# Patient Record
Sex: Male | Born: 1940 | Race: White | Hispanic: No | Marital: Married | Smoking: Former smoker
Health system: Southern US, Community
[De-identification: ages and names within clinical notes are randomized; demographics above are authoritative.]

## PROBLEM LIST (undated history)

## (undated) DIAGNOSIS — K219 Gastro-esophageal reflux disease without esophagitis: Secondary | ICD-10-CM

## (undated) DIAGNOSIS — J869 Pyothorax without fistula: Secondary | ICD-10-CM

## (undated) DIAGNOSIS — I10 Essential (primary) hypertension: Secondary | ICD-10-CM

## (undated) DIAGNOSIS — J449 Chronic obstructive pulmonary disease, unspecified: Secondary | ICD-10-CM

## (undated) DIAGNOSIS — J40 Bronchitis, not specified as acute or chronic: Secondary | ICD-10-CM

## (undated) DIAGNOSIS — N289 Disorder of kidney and ureter, unspecified: Secondary | ICD-10-CM

## (undated) DIAGNOSIS — K649 Unspecified hemorrhoids: Secondary | ICD-10-CM

## (undated) DIAGNOSIS — I493 Ventricular premature depolarization: Secondary | ICD-10-CM

## (undated) DIAGNOSIS — M1990 Unspecified osteoarthritis, unspecified site: Secondary | ICD-10-CM

## (undated) DIAGNOSIS — E785 Hyperlipidemia, unspecified: Secondary | ICD-10-CM

## (undated) HISTORY — DX: Hyperlipidemia, unspecified: E78.5

## (undated) HISTORY — PX: TONSILLECTOMY: SUR1361

## (undated) HISTORY — DX: Ventricular premature depolarization: I49.3

## (undated) HISTORY — DX: Pyothorax without fistula: J86.9

---

## 1988-09-09 HISTORY — PX: APPENDECTOMY: SHX54

## 2011-01-10 DIAGNOSIS — J869 Pyothorax without fistula: Secondary | ICD-10-CM

## 2011-01-10 HISTORY — DX: Pyothorax without fistula: J86.9

## 2011-01-15 DIAGNOSIS — R7989 Other specified abnormal findings of blood chemistry: Secondary | ICD-10-CM

## 2011-01-15 DIAGNOSIS — R071 Chest pain on breathing: Secondary | ICD-10-CM

## 2011-01-16 DIAGNOSIS — I219 Acute myocardial infarction, unspecified: Secondary | ICD-10-CM

## 2011-01-16 DIAGNOSIS — R0602 Shortness of breath: Secondary | ICD-10-CM

## 2011-01-18 ENCOUNTER — Encounter (HOSPITAL_COMMUNITY): Payer: Self-pay | Admitting: General Practice

## 2011-01-18 ENCOUNTER — Inpatient Hospital Stay (HOSPITAL_COMMUNITY)
Admission: AD | Admit: 2011-01-18 | Discharge: 2011-01-30 | DRG: 853 | Disposition: A | Discharge status: Home Health | Payer: Medicare (Managed Care) | Source: Other Acute Inpatient Hospital | Attending: Cardiothoracic Surgery | Admitting: Cardiothoracic Surgery

## 2011-01-18 ENCOUNTER — Inpatient Hospital Stay (HOSPITAL_COMMUNITY): Payer: Medicare (Managed Care)

## 2011-01-18 ENCOUNTER — Other Ambulatory Visit: Payer: Self-pay

## 2011-01-18 DIAGNOSIS — J869 Pyothorax without fistula: Secondary | ICD-10-CM

## 2011-01-18 DIAGNOSIS — E876 Hypokalemia: Secondary | ICD-10-CM | POA: Diagnosis present

## 2011-01-18 DIAGNOSIS — J189 Pneumonia, unspecified organism: Secondary | ICD-10-CM | POA: Diagnosis present

## 2011-01-18 DIAGNOSIS — E8779 Other fluid overload: Secondary | ICD-10-CM | POA: Diagnosis not present

## 2011-01-18 DIAGNOSIS — Z9889 Other specified postprocedural states: Secondary | ICD-10-CM

## 2011-01-18 DIAGNOSIS — A4902 Methicillin resistant Staphylococcus aureus infection, unspecified site: Secondary | ICD-10-CM | POA: Diagnosis present

## 2011-01-18 DIAGNOSIS — I214 Non-ST elevation (NSTEMI) myocardial infarction: Secondary | ICD-10-CM | POA: Diagnosis present

## 2011-01-18 DIAGNOSIS — Z79899 Other long term (current) drug therapy: Secondary | ICD-10-CM

## 2011-01-18 DIAGNOSIS — D62 Acute posthemorrhagic anemia: Secondary | ICD-10-CM | POA: Diagnosis not present

## 2011-01-18 DIAGNOSIS — L8992 Pressure ulcer of unspecified site, stage 2: Secondary | ICD-10-CM | POA: Diagnosis present

## 2011-01-18 DIAGNOSIS — R000 Tachycardia, unspecified: Secondary | ICD-10-CM

## 2011-01-18 DIAGNOSIS — I219 Acute myocardial infarction, unspecified: Secondary | ICD-10-CM | POA: Diagnosis present

## 2011-01-18 DIAGNOSIS — A419 Sepsis, unspecified organism: Principal | ICD-10-CM | POA: Diagnosis present

## 2011-01-18 DIAGNOSIS — L89109 Pressure ulcer of unspecified part of back, unspecified stage: Secondary | ICD-10-CM | POA: Diagnosis present

## 2011-01-18 DIAGNOSIS — I498 Other specified cardiac arrhythmias: Secondary | ICD-10-CM | POA: Diagnosis present

## 2011-01-18 DIAGNOSIS — D72829 Elevated white blood cell count, unspecified: Secondary | ICD-10-CM | POA: Diagnosis present

## 2011-01-18 DIAGNOSIS — B370 Candidal stomatitis: Secondary | ICD-10-CM | POA: Diagnosis not present

## 2011-01-18 DIAGNOSIS — F17200 Nicotine dependence, unspecified, uncomplicated: Secondary | ICD-10-CM | POA: Diagnosis present

## 2011-01-18 DIAGNOSIS — J449 Chronic obstructive pulmonary disease, unspecified: Secondary | ICD-10-CM | POA: Diagnosis present

## 2011-01-18 DIAGNOSIS — E119 Type 2 diabetes mellitus without complications: Secondary | ICD-10-CM | POA: Diagnosis present

## 2011-01-18 DIAGNOSIS — I10 Essential (primary) hypertension: Secondary | ICD-10-CM | POA: Diagnosis present

## 2011-01-18 DIAGNOSIS — J4489 Other specified chronic obstructive pulmonary disease: Secondary | ICD-10-CM | POA: Diagnosis present

## 2011-01-18 DIAGNOSIS — K219 Gastro-esophageal reflux disease without esophagitis: Secondary | ICD-10-CM | POA: Diagnosis present

## 2011-01-18 DIAGNOSIS — K5900 Constipation, unspecified: Secondary | ICD-10-CM | POA: Diagnosis not present

## 2011-01-18 HISTORY — DX: Unspecified osteoarthritis, unspecified site: M19.90

## 2011-01-18 HISTORY — DX: Bronchitis, not specified as acute or chronic: J40

## 2011-01-18 HISTORY — DX: Unspecified hemorrhoids: K64.9

## 2011-01-18 HISTORY — DX: Chronic obstructive pulmonary disease, unspecified: J44.9

## 2011-01-18 HISTORY — DX: Gastro-esophageal reflux disease without esophagitis: K21.9

## 2011-01-18 HISTORY — DX: Essential (primary) hypertension: I10

## 2011-01-18 HISTORY — DX: Disorder of kidney and ureter, unspecified: N28.9

## 2011-01-18 LAB — BLOOD GAS, ARTERIAL
Acid-base deficit: 2.1 mmol/L — ABNORMAL HIGH (ref 0.0–2.0)
Bicarbonate: 21.3 mEq/L (ref 20.0–24.0)
Drawn by: 347641
FIO2: 0.21 %
O2 Saturation: 93.7 %
Patient temperature: 98.6
TCO2: 22.3 mmol/L (ref 0–100)
pCO2 arterial: 31.3 mmHg — ABNORMAL LOW (ref 35.0–45.0)
pH, Arterial: 7.448 (ref 7.350–7.450)
pO2, Arterial: 67.5 mmHg — ABNORMAL LOW (ref 80.0–100.0)

## 2011-01-18 LAB — COMPREHENSIVE METABOLIC PANEL
ALT: 8 U/L (ref 0–53)
ALT: 8 U/L (ref 0–53)
AST: 14 U/L (ref 0–37)
AST: 13 U/L (ref 0–37)
Albumin: 1.7 g/dL — ABNORMAL LOW (ref 3.5–5.2)
Alkaline Phosphatase: 66 U/L (ref 39–117)
Alkaline Phosphatase: 55 U/L (ref 39–117)
BUN: 6 mg/dL (ref 6–23)
CO2: 23 mEq/L (ref 19–32)
CO2: 22 mEq/L (ref 19–32)
Calcium: 7.9 mg/dL — ABNORMAL LOW (ref 8.4–10.5)
Chloride: 101 mEq/L (ref 96–112)
Chloride: 102 mEq/L (ref 96–112)
Creatinine, Ser: 0.85 mg/dL (ref 0.50–1.35)
GFR calc Af Amer: >90 mL/min (ref >90)
GFR calc Af Amer: >90 mL/min (ref >90)
GFR calc non Af Amer: 88 mL/min — ABNORMAL LOW (ref >90)
GFR calc non Af Amer: 88 mL/min — ABNORMAL LOW (ref >90)
Glucose, Bld: 214 mg/dL — ABNORMAL HIGH (ref 70–99)
Glucose, Bld: 222 mg/dL — ABNORMAL HIGH (ref 70–99)
Potassium: 3.4 mEq/L — ABNORMAL LOW (ref 3.5–5.1)
Potassium: 3 mEq/L — ABNORMAL LOW (ref 3.5–5.1)
Sodium: 136 mEq/L (ref 135–145)
Sodium: 132 mEq/L — ABNORMAL LOW (ref 135–145)
Total Bilirubin: 0.6 mg/dL (ref 0.3–1.2)
Total Bilirubin: 0.5 mg/dL (ref 0.3–1.2)
Total Protein: 6 g/dL (ref 6.0–8.3)

## 2011-01-18 LAB — GLUCOSE, CAPILLARY: Glucose-Capillary: 232 mg/dL — ABNORMAL HIGH (ref 70–99)

## 2011-01-18 LAB — CBC
HCT: 37.9 % — ABNORMAL LOW (ref 39.0–52.0)
Hemoglobin: 13.6 g/dL (ref 13.0–17.0)
MCH: 28.7 pg (ref 26.0–34.0)
MCH: 28.4 pg (ref 26.0–34.0)
MCHC: 36 g/dL (ref 30.0–36.0)
MCHC: 35.9 g/dL (ref 30.0–36.0)
MCV: 79.7 fL (ref 78.0–100.0)
MCV: 79.1 fL (ref 78.0–100.0)
Platelets: 319 10*3/uL (ref 150–400)
Platelets: 317 10*3/uL (ref 150–400)
RBC: 5.12 MIL/uL (ref 4.22–5.81)
RBC: 4.79 MIL/uL (ref 4.22–5.81)
RDW: 12.9 % (ref 11.5–15.5)
RDW: 12.7 % (ref 11.5–15.5)
WBC: 15.1 10*3/uL — ABNORMAL HIGH (ref 4.0–10.5)

## 2011-01-18 LAB — TYPE AND SCREEN
ABO/RH(D): O POS
Antibody Screen: NEGATIVE

## 2011-01-18 LAB — APTT: aPTT: 29 seconds (ref 24–37)

## 2011-01-18 LAB — PROTIME-INR
INR: 1.31 (ref 0.00–1.49)
Prothrombin Time: 16.5 seconds — ABNORMAL HIGH (ref 11.6–15.2)

## 2011-01-18 LAB — ABO/RH: ABO/RH(D): O POS

## 2011-01-18 MED ORDER — INSULIN ASPART 100 UNIT/ML ~~LOC~~ SOLN: 0.0000 [IU] | Freq: Three times a day (TID) | SUBCUTANEOUS | Status: DC

## 2011-01-18 MED ORDER — INSULIN ASPART 100 UNIT/ML ~~LOC~~ SOLN
0.0000 [IU] | Freq: Three times a day (TID) | SUBCUTANEOUS | Status: DC
  Administered 2011-01-18: 3 [IU] via SUBCUTANEOUS
  Administered 2011-01-19: 5 [IU] via SUBCUTANEOUS
  Administered 2011-01-19: 3 [IU] via SUBCUTANEOUS

## 2011-01-18 MED ORDER — DOCUSATE SODIUM 100 MG PO CAPS
100.0000 mg | ORAL_CAPSULE | Freq: Every day | ORAL | Status: DC
  Administered 2011-01-18 – 2011-01-26 (×8): 100 mg via ORAL
  Filled 2011-01-18 (×9): qty 1
  Filled 2011-01-18: qty 2

## 2011-01-18 MED ORDER — FOLIC ACID 1 MG PO TABS
1.0000 mg | ORAL_TABLET | Freq: Every day | ORAL | Status: DC
  Administered 2011-01-18 – 2011-01-30 (×13): 1 mg via ORAL
  Filled 2011-01-18 (×14): qty 1

## 2011-01-18 MED ORDER — METOPROLOL TARTRATE 50 MG PO TABS
75.0000 mg | ORAL_TABLET | Freq: Two times a day (BID) | ORAL | Status: DC
  Administered 2011-01-18 – 2011-01-21 (×5): 75 mg via ORAL
  Filled 2011-01-18 (×10): qty 1

## 2011-01-18 MED ORDER — GUAIFENESIN ER 600 MG PO TB12
1200.0000 mg | ORAL_TABLET | Freq: Two times a day (BID) | ORAL | Status: DC
  Administered 2011-01-18 – 2011-01-30 (×23): 1200 mg via ORAL
  Filled 2011-01-18 (×26): qty 2

## 2011-01-18 MED ORDER — ROSUVASTATIN CALCIUM 20 MG PO TABS
20.0000 mg | ORAL_TABLET | Freq: Every day | ORAL | Status: DC
  Administered 2011-01-18 – 2011-01-30 (×12): 20 mg via ORAL
  Filled 2011-01-18 (×13): qty 1

## 2011-01-18 MED ORDER — PIPERACILLIN-TAZOBACTAM 3.375 G IVPB: 3.3750 g | Freq: Four times a day (QID) | INTRAVENOUS | Status: DC

## 2011-01-18 MED ORDER — ASPIRIN EC 81 MG PO TBEC
81.0000 mg | DELAYED_RELEASE_TABLET | Freq: Every day | ORAL | Status: DC
  Administered 2011-01-18 – 2011-01-30 (×13): 81 mg via ORAL
  Filled 2011-01-18 (×13): qty 1

## 2011-01-18 MED ORDER — ACETAMINOPHEN 650 MG RE SUPP: 650.0000 mg | Freq: Four times a day (QID) | RECTAL | Status: DC | PRN

## 2011-01-18 MED ORDER — ONDANSETRON HCL 4 MG/2ML IJ SOLN: 4.0000 mg | Freq: Four times a day (QID) | INTRAMUSCULAR | Status: DC | PRN

## 2011-01-18 MED ORDER — ONDANSETRON HCL 4 MG PO TABS: 4.0000 mg | ORAL_TABLET | Freq: Four times a day (QID) | ORAL | Status: DC | PRN

## 2011-01-18 MED ORDER — NICOTINE 21 MG/24HR TD PT24
21.0000 mg | MEDICATED_PATCH | Freq: Every day | TRANSDERMAL | Status: DC
  Administered 2011-01-18 – 2011-01-30 (×14): 21 mg via TRANSDERMAL
  Filled 2011-01-18 (×13): qty 1

## 2011-01-18 MED ORDER — HYDROCOD POLST-CHLORPHEN POLST 10-8 MG/5ML PO LQCR
5.0000 mL | Freq: Four times a day (QID) | ORAL | Status: DC | PRN
  Administered 2011-01-18 – 2011-01-19 (×2): 5 mL via ORAL
  Filled 2011-01-18 (×2): qty 5

## 2011-01-18 MED ORDER — PIPERACILLIN-TAZOBACTAM 3.375 G IVPB
3.3750 g | Freq: Three times a day (TID) | INTRAVENOUS | Status: DC
  Administered 2011-01-18 – 2011-01-30 (×34): 3.375 g via INTRAVENOUS
  Filled 2011-01-18 (×40): qty 50

## 2011-01-18 MED ORDER — INSULIN ASPART 100 UNIT/ML ~~LOC~~ SOLN
0.0000 [IU] | Freq: Every day | SUBCUTANEOUS | Status: DC
  Administered 2011-01-18: 2 [IU] via SUBCUTANEOUS
  Filled 2011-01-18: qty 3

## 2011-01-18 MED ORDER — HYDROCODONE-ACETAMINOPHEN 5-325 MG PO TABS: 1.0000 | ORAL_TABLET | ORAL | Status: DC | PRN

## 2011-01-18 MED ORDER — ACETAMINOPHEN 325 MG PO TABS
650.0000 mg | ORAL_TABLET | Freq: Four times a day (QID) | ORAL | Status: DC | PRN
  Administered 2011-01-18: 650 mg via ORAL
  Filled 2011-01-18: qty 2

## 2011-01-18 MED ORDER — GLIPIZIDE 5 MG PO TABS
5.0000 mg | ORAL_TABLET | Freq: Two times a day (BID) | ORAL | Status: DC
  Administered 2011-01-20 – 2011-01-24 (×10): 5 mg via ORAL
  Filled 2011-01-18 (×17): qty 1

## 2011-01-18 MED ORDER — PANTOPRAZOLE SODIUM 40 MG IV SOLR
40.0000 mg | Freq: Every day | INTRAVENOUS | Status: DC
  Administered 2011-01-18 – 2011-01-20 (×3): 40 mg via INTRAVENOUS
  Filled 2011-01-18 (×4): qty 40

## 2011-01-18 MED ORDER — VITAMIN B-1 100 MG PO TABS
100.0000 mg | ORAL_TABLET | Freq: Every day | ORAL | Status: DC
  Administered 2011-01-18 – 2011-01-29 (×12): 100 mg via ORAL
  Filled 2011-01-18 (×14): qty 1

## 2011-01-18 MED ORDER — TRAMADOL HCL 50 MG PO TABS: 50.0000 mg | ORAL_TABLET | Freq: Four times a day (QID) | ORAL | Status: DC | PRN

## 2011-01-18 MED ORDER — INSULIN ASPART 100 UNIT/ML ~~LOC~~ SOLN: 0.0000 [IU] | Freq: Every day | SUBCUTANEOUS | Status: DC
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
## 2011-01-18 NOTE — Progress Notes (Signed)
Gave patient 3 units of insulin for blood sugar of 199.  The insulin order wasn't until tomorrow but MD told to go ahead and give tonight with dinner.  Will continue to monitor.

## 2011-01-18 NOTE — Progress Notes (Signed)
SUBJECTIVE: Oscar [name] is known to me as I rounded on him while he was at [facility]. He has prolonged history of diabetes. He presented there with pneumonia and had progressive left-sided pleural effusion in spite of being treated with antibiotics. He was tachycardic. Initially he was thought to have atrial fibrillation. However, reviewing the ECG revealed sinus tachycardia with frequent PACs and occasional PVCs. He had chest discomfort that was mostly pleuritic in nature. However, his cardiac enzymes were mildly elevated with a troponin of 0.9. He was started on metoprolol. Plavix was added as well. He had an echocardiogram done which showed normal LV systolic function and no significant wall motion abnormalities. The plan was to perform a pharmacologic nuclear stress test after his respiratory status improves. However, he continued to have large size left pleural effusion. Thoracentesis was suggestive of empyema and he was thus transferred to [facility] for thoracic surgery consult.   Filed Vitals:   01/18/11 1420 01/18/11 1500 01/18/11 1519  BP: 109/73  112/62  Pulse: 140  127  Temp: 97.9 F (36.6 C)  98 F (36.7 C)  TempSrc: Oral  Oral  Resp: 19  20  Height: 6\' 2"  (1.88 m) 6\' 2"  (1.88 m)   Weight: 92.534 kg (204 lb) 92.534 kg (204 lb)   SpO2: 91%  92%   PHYSICAL EXAM General: Well developed, well nourished, in mild distress Head: Eyes PERRLA, No xanthomas.   Normal cephalic and atramatic  Lungs: Decreased breath sounds in the left base. Heart: Regular with frequent premature beats. He is tachycardic. Abdomen: Bowel sounds are positive, abdomen soft and non-tender without masses or                  Hernia's noted. Extremities: No clubbing.  Neuro: Alert and oriented X 3. Psych:  Good affect, responds appropriately  TELEMETRY: Reviewed telemetry pt in sinus tachycardia with frequent PACs. The  ASSESSMENT AND PLAN: 1. Pneumonia with large pleural effusion and possible empyema:  continue antibiotics. He will need surgery.  2. Sinus tachycardia with PACs and PVCs. No evidence of A-fib. Increase Metoprolol to 75 mg po bid and uptitrate to HR <100. Don't resume ACEI in order to allow more uptitration of rate control.  3. Recent type 2 NSTEMI likely supply demand. Echo showed normal LVSF and wall motion. Continue heart rate. Continue Aspirin but stop Plavix due to anticipated surgery. Start a statin given prolonged history of diabetes.  No plans for cardiac testing before surgery. Can proceed at an overall moderate risk. I suspect that his tachycardia should improve after effectively treating his infection.   Lorine Bears, MD, [facility] 01/18/2011 4:34 PM

## 2011-01-19 ENCOUNTER — Other Ambulatory Visit: Payer: Self-pay | Admitting: Cardiothoracic Surgery

## 2011-01-19 ENCOUNTER — Encounter (HOSPITAL_COMMUNITY)
Admission: AD | Disposition: A | Discharge status: Home Health | Payer: Self-pay | Source: Other Acute Inpatient Hospital | Attending: Cardiothoracic Surgery

## 2011-01-19 ENCOUNTER — Inpatient Hospital Stay (HOSPITAL_COMMUNITY): Payer: Medicare (Managed Care)

## 2011-01-19 ENCOUNTER — Encounter (HOSPITAL_COMMUNITY): Payer: Self-pay

## 2011-01-19 DIAGNOSIS — I214 Non-ST elevation (NSTEMI) myocardial infarction: Secondary | ICD-10-CM

## 2011-01-19 DIAGNOSIS — J869 Pyothorax without fistula: Secondary | ICD-10-CM

## 2011-01-19 HISTORY — PX: VIDEO BRONCHOSCOPY: SHX5072

## 2011-01-19 HISTORY — PX: VIDEO ASSISTED THORACOSCOPY: SHX5073

## 2011-01-19 LAB — BODY FLUID CULTURE: Gram Stain: NONE SEEN

## 2011-01-19 LAB — URINALYSIS, ROUTINE W REFLEX MICROSCOPIC
Glucose, UA: 100 mg/dL — AB
Hgb urine dipstick: NEGATIVE
Ketones, ur: 15 mg/dL — AB
Leukocytes, UA: NEGATIVE
Nitrite: NEGATIVE
Protein, ur: NEGATIVE mg/dL
Specific Gravity, Urine: 1.015 (ref 1.005–1.030)
Urobilinogen, UA: 4 mg/dL — ABNORMAL HIGH (ref 0.0–1.0)
pH: 5.5 (ref 5.0–8.0)

## 2011-01-19 LAB — POCT I-STAT 3, ART BLOOD GAS (G3+)
Bicarbonate: 21.7 mEq/L (ref 20.0–24.0)
TCO2: 23 mmol/L (ref 0–100)
pCO2 arterial: 39.9 mmHg (ref 35.0–45.0)
pH, Arterial: 7.341 — ABNORMAL LOW (ref 7.350–7.450)
pO2, Arterial: 70 mmHg — ABNORMAL LOW (ref 80.0–100.0)

## 2011-01-19 LAB — PROTIME-INR: INR: 1.27 (ref 0.00–1.49)

## 2011-01-19 LAB — GLUCOSE, CAPILLARY
Glucose-Capillary: 196 mg/dL — ABNORMAL HIGH (ref 70–99)
Glucose-Capillary: 180 mg/dL — ABNORMAL HIGH (ref 70–99)
Glucose-Capillary: 276 mg/dL — ABNORMAL HIGH (ref 70–99)

## 2011-01-19 LAB — CBC
MCV: 80.2 fL (ref 78.0–100.0)
Platelets: 274 10*3/uL (ref 150–400)
RBC: 3.73 MIL/uL — ABNORMAL LOW (ref 4.22–5.81)
WBC: 19 10*3/uL — ABNORMAL HIGH (ref 4.0–10.5)

## 2011-01-19 SURGERY — VIDEO ASSISTED THORACOSCOPY
Anesthesia: General | Site: Chest | Wound class: Clean

## 2011-01-19 MED ORDER — FENTANYL CITRATE 0.05 MG/ML IJ SOLN
50.0000 ug | INTRAMUSCULAR | Status: DC | PRN
  Administered 2011-01-19: 100 ug via INTRAVENOUS

## 2011-01-19 MED ORDER — PROPOFOL 10 MG/ML IV EMUL
INTRAVENOUS | Status: DC | PRN
  Administered 2011-01-19: 110 mg via INTRAVENOUS

## 2011-01-19 MED ORDER — FENTANYL CITRATE 0.05 MG/ML IJ SOLN
INTRAMUSCULAR | Status: DC | PRN
  Administered 2011-01-19: 150 ug via INTRAVENOUS
  Administered 2011-01-19: 100 ug via INTRAVENOUS

## 2011-01-19 MED ORDER — VANCOMYCIN HCL IN DEXTROSE 1-5 GM/200ML-% IV SOLN
1000.0000 mg | Freq: Two times a day (BID) | INTRAVENOUS | Status: AC
  Administered 2011-01-19: 1000 mg via INTRAVENOUS
  Filled 2011-01-19: qty 200

## 2011-01-19 MED ORDER — POTASSIUM CHLORIDE 10 MEQ/50ML IV SOLN
10.0000 meq | INTRAVENOUS | Status: AC
  Administered 2011-01-19 (×3): 10 meq via INTRAVENOUS
  Filled 2011-01-19: qty 150

## 2011-01-19 MED ORDER — ALBUMIN HUMAN 5 % IV SOLN
INTRAVENOUS | Status: DC | PRN
  Administered 2011-01-19 (×2): via INTRAVENOUS

## 2011-01-19 MED ORDER — OXYCODONE HCL 5 MG PO TABS
5.0000 mg | ORAL_TABLET | ORAL | Status: AC | PRN
  Administered 2011-01-20: 5 mg via ORAL
  Filled 2011-01-19: qty 1

## 2011-01-19 MED ORDER — ONDANSETRON HCL 4 MG/2ML IJ SOLN: 4.0000 mg | Freq: Four times a day (QID) | INTRAMUSCULAR | Status: DC | PRN

## 2011-01-19 MED ORDER — OXYCODONE-ACETAMINOPHEN 5-325 MG PO TABS: 1.0000 | ORAL_TABLET | ORAL | Status: DC | PRN

## 2011-01-19 MED ORDER — FENTANYL BOLUS VIA INFUSION
50.0000 ug | Freq: Four times a day (QID) | INTRAVENOUS | Status: DC | PRN
  Filled 2011-01-19: qty 100

## 2011-01-19 MED ORDER — EPHEDRINE SULFATE 50 MG/ML IJ SOLN
INTRAMUSCULAR | Status: DC | PRN
  Administered 2011-01-19 (×2): 10 mg via INTRAVENOUS

## 2011-01-19 MED ORDER — ACETAMINOPHEN 10 MG/ML IV SOLN
1000.0000 mg | Freq: Four times a day (QID) | INTRAVENOUS | Status: AC
  Administered 2011-01-19 – 2011-01-20 (×3): 1000 mg via INTRAVENOUS
  Filled 2011-01-19 (×4): qty 100

## 2011-01-19 MED ORDER — ALBUMIN HUMAN 5 % IV SOLN
12.5000 g | Freq: Once | INTRAVENOUS | Status: AC
  Administered 2011-01-19: 12.5 g via INTRAVENOUS
  Filled 2011-01-19: qty 250

## 2011-01-19 MED ORDER — BUPIVACAINE 0.5 % ON-Q PUMP SINGLE CATH 400 ML
400.0000 mL | INJECTION | Status: DC
  Filled 2011-01-19: qty 400

## 2011-01-19 MED ORDER — LIVING WELL WITH DIABETES BOOK
Freq: Once | Status: DC
  Filled 2011-01-19: qty 1

## 2011-01-19 MED ORDER — MORPHINE SULFATE 2 MG/ML IJ SOLN: 2.0000 mg | INTRAMUSCULAR | Status: DC | PRN

## 2011-01-19 MED ORDER — ROCURONIUM BROMIDE 100 MG/10ML IV SOLN
INTRAVENOUS | Status: DC | PRN
  Administered 2011-01-19: 50 mg via INTRAVENOUS

## 2011-01-19 MED ORDER — HEMOSTATIC AGENTS (NO CHARGE) OPTIME
TOPICAL | Status: DC | PRN
  Administered 2011-01-19: 1 via TOPICAL

## 2011-01-19 MED ORDER — CHLORHEXIDINE GLUCONATE 0.12 % MT SOLN
OROMUCOSAL | Status: AC
  Administered 2011-01-19: 15 mL
  Filled 2011-01-19: qty 15

## 2011-01-19 MED ORDER — ALBUTEROL SULFATE (5 MG/ML) 0.5% IN NEBU
2.5000 mg | INHALATION_SOLUTION | RESPIRATORY_TRACT | Status: DC
  Administered 2011-01-19 – 2011-01-20 (×5): 2.5 mg via RESPIRATORY_TRACT
  Filled 2011-01-19 (×6): qty 0.5

## 2011-01-19 MED ORDER — KCL IN DEXTROSE-NACL 20-5-0.45 MEQ/L-%-% IV SOLN
INTRAVENOUS | Status: DC
  Administered 2011-01-19 – 2011-01-20 (×2): via INTRAVENOUS
  Filled 2011-01-19 (×4): qty 1000

## 2011-01-19 MED ORDER — LACTATED RINGERS IV SOLN
INTRAVENOUS | Status: DC
  Administered 2011-01-19: 13:00:00 via INTRAVENOUS

## 2011-01-19 MED ORDER — LACTATED RINGERS IV SOLN
INTRAVENOUS | Status: DC | PRN
  Administered 2011-01-19 (×5): via INTRAVENOUS

## 2011-01-19 MED ORDER — PHENYLEPHRINE HCL 10 MG/ML IJ SOLN
10.0000 mg | INTRAVENOUS | Status: DC | PRN
  Administered 2011-01-19: 50 ug/min via INTRAVENOUS

## 2011-01-19 MED ORDER — BISACODYL 5 MG PO TBEC
10.0000 mg | DELAYED_RELEASE_TABLET | Freq: Every day | ORAL | Status: DC
  Administered 2011-01-20 – 2011-01-26 (×6): 10 mg via ORAL
  Filled 2011-01-19 (×7): qty 2

## 2011-01-19 MED ORDER — VECURONIUM BROMIDE 10 MG IV SOLR
INTRAVENOUS | Status: DC | PRN
  Administered 2011-01-19: 3 mg via INTRAVENOUS
  Administered 2011-01-19: 5 mg via INTRAVENOUS

## 2011-01-19 MED ORDER — INSULIN ASPART 100 UNIT/ML ~~LOC~~ SOLN
0.0000 [IU] | SUBCUTANEOUS | Status: DC
  Administered 2011-01-19 (×2): 12 [IU] via SUBCUTANEOUS
  Administered 2011-01-20: 2 [IU] via SUBCUTANEOUS
  Administered 2011-01-20: 4 [IU] via SUBCUTANEOUS
  Administered 2011-01-20: 8 [IU] via SUBCUTANEOUS
  Administered 2011-01-20: 4 [IU] via SUBCUTANEOUS
  Administered 2011-01-20: 12 [IU] via SUBCUTANEOUS
  Administered 2011-01-21: 4 [IU] via SUBCUTANEOUS
  Administered 2011-01-21: 8 [IU] via SUBCUTANEOUS
  Administered 2011-01-21 (×2): 4 [IU] via SUBCUTANEOUS
  Filled 2011-01-19: qty 3

## 2011-01-19 MED ORDER — SODIUM CHLORIDE 0.9 % IV SOLN
2.0000 mg/h | INTRAVENOUS | Status: DC
  Administered 2011-01-19: 2 mg/h via INTRAVENOUS
  Filled 2011-01-19: qty 10

## 2011-01-19 MED ORDER — SENNOSIDES-DOCUSATE SODIUM 8.6-50 MG PO TABS
1.0000 | ORAL_TABLET | Freq: Every evening | ORAL | Status: DC | PRN
  Filled 2011-01-19: qty 1

## 2011-01-19 MED ORDER — LIDOCAINE HCL (CARDIAC) 20 MG/ML IV SOLN
INTRAVENOUS | Status: DC | PRN
  Administered 2011-01-19: 100 mg via INTRAVENOUS

## 2011-01-19 MED ORDER — DEXMEDETOMIDINE HCL 100 MCG/ML IV SOLN
0.4000 ug/kg/h | INTRAVENOUS | Status: DC
  Administered 2011-01-19: 0.7 ug/kg/h via INTRAVENOUS
  Filled 2011-01-19 (×2): qty 2

## 2011-01-19 MED ORDER — SODIUM CHLORIDE 0.9 % IV SOLN
INTRAVENOUS | Status: DC
  Administered 2011-01-19: 20:00:00 via INTRAVENOUS
  Administered 2011-01-22: 20 mL via INTRAVENOUS

## 2011-01-19 MED ORDER — PHENYLEPHRINE HCL 10 MG/ML IJ SOLN
50.0000 ug/min | INTRAMUSCULAR | Status: DC
  Administered 2011-01-19: 10 ug/min via INTRAVENOUS
  Administered 2011-01-20: 15 ug/min via INTRAVENOUS
  Filled 2011-01-19 (×2): qty 1

## 2011-01-19 MED ORDER — SODIUM CHLORIDE 0.9 % IR SOLN
Status: DC | PRN
  Administered 2011-01-19: 2000 mL

## 2011-01-19 MED ORDER — POTASSIUM CHLORIDE 10 MEQ/50ML IV SOLN
10.0000 meq | Freq: Every day | INTRAVENOUS | Status: DC | PRN
  Administered 2011-01-20 – 2011-01-22 (×6): 10 meq via INTRAVENOUS
  Filled 2011-01-19: qty 50
  Filled 2011-01-19: qty 150
  Filled 2011-01-19 (×3): qty 50
  Filled 2011-01-19: qty 150

## 2011-01-19 MED ORDER — FENTANYL CITRATE 0.05 MG/ML IJ SOLN
50.0000 ug/h | INTRAMUSCULAR | Status: DC
  Administered 2011-01-19: 50 ug/h via INTRAVENOUS
  Filled 2011-01-19: qty 50

## 2011-01-19 MED ORDER — MIDAZOLAM HCL 5 MG/5ML IJ SOLN
INTRAMUSCULAR | Status: DC | PRN
  Administered 2011-01-19 (×2): 2 mg via INTRAVENOUS

## 2011-01-19 MED ORDER — BUPIVACAINE ON-Q PAIN PUMP (FOR ORDER SET NO CHG)
INJECTION | Status: DC
  Filled 2011-01-19: qty 1

## 2011-01-19 MED ORDER — FENTANYL CITRATE 0.05 MG/ML IJ SOLN
INTRAMUSCULAR | Status: AC
  Filled 2011-01-19: qty 2

## 2011-01-19 MED ORDER — POTASSIUM CHLORIDE CRYS ER 20 MEQ PO TBCR
20.0000 meq | EXTENDED_RELEASE_TABLET | Freq: Once | ORAL | Status: AC
  Administered 2011-01-19: 20 meq via ORAL
  Filled 2011-01-19: qty 1

## 2011-01-19 MED ORDER — MIDAZOLAM BOLUS VIA INFUSION
1.0000 mg | INTRAVENOUS | Status: DC | PRN
  Filled 2011-01-19: qty 2

## 2011-01-19 MED ORDER — SUFENTANIL CITRATE 50 MCG/ML IV SOLN
INTRAVENOUS | Status: DC | PRN
  Administered 2011-01-19: 10 ug via INTRAVENOUS
  Administered 2011-01-19: 15 ug via INTRAVENOUS
  Administered 2011-01-19: 10 ug via INTRAVENOUS
  Administered 2011-01-19: 15 ug via INTRAVENOUS

## 2011-01-19 MED ORDER — PHENYLEPHRINE HCL 10 MG/ML IJ SOLN
INTRAMUSCULAR | Status: DC | PRN
  Administered 2011-01-19 (×3): 80 ug via INTRAVENOUS

## 2011-01-19 SURGICAL SUPPLY — 70 items
BAG DECANTER FOR FLEXI CONT (MISCELLANEOUS) IMPLANT
BLADE SURG 11 STRL SS (BLADE) IMPLANT
CANISTER SUCTION 2500CC (MISCELLANEOUS) ×4 IMPLANT
CATH KIT ON Q 5IN SLV (PAIN MANAGEMENT) ×4 IMPLANT
CATH ROBINSON RED A/P 22FR (CATHETERS) IMPLANT
CATH THORACIC 28FR (CATHETERS) IMPLANT
CATH THORACIC 36FR (CATHETERS) IMPLANT
CATH THORACIC 36FR RT ANG (CATHETERS) IMPLANT
CLOSURE WOUND 1/2 X4 (GAUZE/BANDAGES/DRESSINGS) ×1
CLOTH BEACON ORANGE TIMEOUT ST (SAFETY) ×4 IMPLANT
CONN 1/2X3/8X3/8 Y GISH (MISCELLANEOUS) ×4 IMPLANT
CONN ST 1/4X3/8  BEN (MISCELLANEOUS) ×4
CONN ST 1/4X3/8 BEN (MISCELLANEOUS) ×4 IMPLANT
CONN Y 3/8X3/8X3/8  BEN (MISCELLANEOUS) ×2
CONN Y 3/8X3/8X3/8 BEN (MISCELLANEOUS) ×2 IMPLANT
CONT SPEC 4OZ CLIKSEAL STRL BL (MISCELLANEOUS) ×16 IMPLANT
COVER SURGICAL LIGHT HANDLE (MISCELLANEOUS) ×8 IMPLANT
DRAIN CHANNEL 28F RND 3/8 FF (WOUND CARE) ×4 IMPLANT
DRAIN CHANNEL 32F RND 10.7 FF (WOUND CARE) ×4 IMPLANT
DRAPE LAPAROSCOPIC ABDOMINAL (DRAPES) ×4 IMPLANT
DRAPE SLUSH MACHINE 52X66 (DRAPES) ×4 IMPLANT
DRSG OPSITE 4X5.5 SM (GAUZE/BANDAGES/DRESSINGS) ×4 IMPLANT
ELECT REM PT RETURN 9FT ADLT (ELECTROSURGICAL) ×4
ELECTRODE REM PT RTRN 9FT ADLT (ELECTROSURGICAL) ×2 IMPLANT
GLOVE BIO SURGEON STRL SZ 6 (GLOVE) ×4 IMPLANT
GLOVE BIO SURGEON STRL SZ7.5 (GLOVE) ×16 IMPLANT
GLOVE BIOGEL PI IND STRL 6 (GLOVE) ×4 IMPLANT
GLOVE BIOGEL PI INDICATOR 6 (GLOVE) ×4
GOWN STRL NON-REIN LRG LVL3 (GOWN DISPOSABLE) ×16 IMPLANT
HEMOSTAT SURGICEL 2X14 (HEMOSTASIS) ×4 IMPLANT
KIT BASIN OR (CUSTOM PROCEDURE TRAY) ×4 IMPLANT
KIT ROOM TURNOVER OR (KITS) ×4 IMPLANT
KIT SUCTION CATH 14FR (SUCTIONS) ×4 IMPLANT
NS IRRIG 1000ML POUR BTL (IV SOLUTION) ×8 IMPLANT
PACK CHEST (CUSTOM PROCEDURE TRAY) ×4 IMPLANT
PAD ARMBOARD 7.5X6 YLW CONV (MISCELLANEOUS) ×8 IMPLANT
SEALANT SURG COSEAL 4ML (VASCULAR PRODUCTS) IMPLANT
SOLUTION ANTI FOG 6CC (MISCELLANEOUS) ×4 IMPLANT
SPONGE GAUZE 4X4 12PLY (GAUZE/BANDAGES/DRESSINGS) ×4 IMPLANT
SPONGE TONSIL 1.25 RF SGL STRG (GAUZE/BANDAGES/DRESSINGS) ×8 IMPLANT
STAPLER VISISTAT 35W (STAPLE) ×4 IMPLANT
STRIP CLOSURE SKIN 1/2X4 (GAUZE/BANDAGES/DRESSINGS) ×3 IMPLANT
SUT CHROMIC 3 0 SH 27 (SUTURE) ×12 IMPLANT
SUT ETHILON 3 0 PS 1 (SUTURE) IMPLANT
SUT PROLENE 3 0 SH DA (SUTURE) IMPLANT
SUT PROLENE 4 0 RB 1 (SUTURE)
SUT PROLENE 4-0 RB1 .5 CRCL 36 (SUTURE) IMPLANT
SUT SILK  1 MH (SUTURE) ×6
SUT SILK 1 MH (SUTURE) ×6 IMPLANT
SUT SILK 2 0SH CR/8 30 (SUTURE) IMPLANT
SUT SILK 3 0SH CR/8 30 (SUTURE) IMPLANT
SUT VIC AB 1 CTX 18 (SUTURE) ×8 IMPLANT
SUT VIC AB 2 TP1 27 (SUTURE) IMPLANT
SUT VIC AB 2-0 CTX 36 (SUTURE) ×4 IMPLANT
SUT VIC AB 3-0 X1 27 (SUTURE) ×4 IMPLANT
SUT VICRYL 0 UR6 27IN ABS (SUTURE) IMPLANT
SUT VICRYL 2 TP 1 (SUTURE) IMPLANT
SWAB COLLECTION DEVICE MRSA (MISCELLANEOUS) IMPLANT
SYSTEM SAHARA CHEST DRAIN ATS (WOUND CARE) ×4 IMPLANT
TAPE CLOTH SURG 4X10 WHT LF (GAUZE/BANDAGES/DRESSINGS) ×4 IMPLANT
TIP APPLICATOR SPRAY EXTEND 16 (VASCULAR PRODUCTS) IMPLANT
TOWEL OR 17X24 6PK STRL BLUE (TOWEL DISPOSABLE) ×4 IMPLANT
TOWEL OR 17X26 10 PK STRL BLUE (TOWEL DISPOSABLE) ×8 IMPLANT
TRAP SPECIMEN MUCOUS 40CC (MISCELLANEOUS) ×20 IMPLANT
TRAY FOLEY CATH 14FR (SET/KITS/TRAYS/PACK) ×4 IMPLANT
TUBE ANAEROBIC SPECIMEN COL (MISCELLANEOUS) ×4 IMPLANT
TUBE CONNECTING 12'X1/4 (SUCTIONS) ×1
TUBE CONNECTING 12X1/4 (SUCTIONS) ×3 IMPLANT
TUNNELER SHEATH ON-Q 11GX8 (MISCELLANEOUS) ×4 IMPLANT
WATER STERILE IRR 1000ML POUR (IV SOLUTION) ×8 IMPLANT

## 2011-01-19 NOTE — Brief Op Note (Signed)
01/18/2011 - 01/19/2011  5:38 PM  PATIENT:  Oscar [name]  71 y.o. male  PRE-OPERATIVE DIAGNOSIS:  Left empyema  POST-OPERATIVE DIAGNOSIS:  Left empyema  PROCEDURE:  Procedure(s): VIDEO ASSISTED THORACOSCOPY with decortication of the left lung, drainage of empyema VIDEO BRONCHOSCOPY  SURGEON:  Surgeon(s): Kerin Perna III, MD  PHYSICIAN ASSISTANT: Coral Ceo PA-C    ANESTHESIA:   general  EBL:  Total I/O In: 3550 [I.V.:3000; IV Piggyback:550] Out: 650 [Urine:350; Blood:300]  BLOOD ADMINISTERED:none  Chest tubes  3 chest tubes in the left pleural space   LOCAL MEDICATIONS USED:  NONE  SPECIMEN:  Pleural peel for culture and pathology, pleural fluid for culture and cytology  DISPOSITION OF SPECIMEN  pathology, lab  COUNTS:  YES  TOURNIQUET:  None    PLAN OF CARE: Admit to inpatient   PATIENT DISPOSITION: To SICU intubated   Delay Lovenox prophylaxis for 24 hours

## 2011-01-19 NOTE — Progress Notes (Signed)
Patient ID: Oscar [name], male   DOB: 09-24-1940, 71 y.o.   MRN: [id number] S/p drainage empyema BP 112/62  Pulse 95  Temp(Src) 98.1 F (36.7 C) (Oral)  Resp 19  Ht 6\' 2"  (1.88 m)  Wt 93.441 kg (206 lb)  BMI 26.45 kg/m2  SpO2 91% Intubated, sedated 450 cc bloody fluid from CT since surgery

## 2011-01-19 NOTE — Transfer of Care (Signed)
Immediate Anesthesia Transfer of Care Note  Patient: Oscar [name]  Procedure(s) Performed:  VIDEO ASSISTED THORACOSCOPY; VIDEO BRONCHOSCOPY  Patient Location: SICU  Anesthesia Type: General  Level of Consciousness: sedated, unresponsive and pateint uncooperative  Airway & Oxygen Therapy: Patient remains intubated per anesthesia plan and Patient placed on Ventilator (see vital sign flow sheet for setting)  Post-op Assessment: Report given to PACU RN, Post -op Vital signs reviewed and stable and Patient moving all extremities  Post vital signs: Reviewed and stable Filed Vitals:   01/19/11 1100  BP: 112/62  Pulse: 95  Temp: 36.7 C  Resp: 19    Complications: No apparent anesthesia complications

## 2011-01-19 NOTE — Progress Notes (Signed)
@   Subjective:  Mild dyspnea; chest "sore" in left rib area   Objective:  Filed Vitals:   01/18/11 1519 01/18/11 2015 01/19/11 0500 01/19/11 0610  BP: 112/62 117/62  109/65  Pulse: 127 100  52  Temp: 98 F (36.7 C) 97.6 F (36.4 C)  97.2 F (36.2 C)  TempSrc: Oral Oral  Oral  Resp: 20 20  20   Height:      Weight:   206 lb (93.441 kg)   SpO2: 92% 92%  92%    Intake/Output from previous day:  Intake/Output Summary (Last 24 hours) at 01/19/11 0711 Last data filed at 01/18/11 1900  Gross per 24 hour  Intake    650 ml  Output      0 ml  Net    650 ml    Physical Exam: Physical exam: Well-developed frail in no acute distress.  Skin is warm and dry.  HEENT is significant for deviation of right eye Neck is supple. No thyromegaly.  Chest is diminished BS left lung field Cardiovascular exam is irregular Abdominal exam nontender or distended. No masses palpated. Extremities show trace edema. neuro grossly intact    Lab Results: Basic Metabolic Panel:  Basename 01/18/11 2009 01/18/11 1705  NA 132* 136  K 3.0* 3.4*  CL 102 101  CO2 22 23  GLUCOSE 222* 214*  BUN 6 7  CREATININE 0.85 0.84  CALCIUM 7.9* 8.2*  MG -- 1.7  PHOS -- --   CBC:  Basename 01/18/11 2009 01/18/11 1705  WBC 15.1* 16.4*  NEUTROABS -- --  HGB 13.6 14.7  HCT 37.9* 40.8  MCV 79.1 79.7  PLT 317 319   Cardiac Enzymes: 1) empyema - continue antibiotics; thoracic surgery plans VATS. 2) NSTEMI - continue ASA, metoprolol and statin; most likely demand ischemia; plan myoview after he recovers form lung infection. Add DVT prophylaxis heparin following surgery. 3) tachycardia - continue metoprolol; telemetry reviewed; sinus with pacs and pvcs. 4) DM - continue present meds and follow CBGs. 5) Decubitus ulcer - wound care consult 6) hypertension - continue present BP meds 7) tobacco abuse - DC  Assessment/Plan:    Oscar [name] 01/19/2011, 7:11 AM

## 2011-01-19 NOTE — Progress Notes (Signed)
INITIAL ADULT NUTRITION ASSESSMENT Date: 01/19/2011   Time: 10:23 AM Reason for Assessment: Consult, unintentional weight loss  ASSESSMENT: Male 71 y.o.  Dx:  Patient Active Problem List  Diagnoses  . Tachycardia  . Myocardial infarction  . Acute myocardial infarction, subendocardial infarction, initial episode of care    Hx:  Past Medical History  Diagnosis Date  . Hypertension   . Diabetes mellitus   . Bronchitis   . GERD (gastroesophageal reflux disease)   . Hemorrhoid   . Arthritis     "fingers"  . COPD (chronic obstructive pulmonary disease)   . Acute renal insufficiency     During admission 01/2011    Related Meds:     . aspirin EC  81 mg Oral Daily  . docusate sodium  100 mg Oral Daily  . folic acid  1 mg Oral Daily  . glipiZIDE  5 mg Oral BID AC  . guaiFENesin  1,200 mg Oral BID  . insulin aspart  0-15 Units Subcutaneous TID WC  . insulin aspart  0-5 Units Subcutaneous QHS  . metoprolol tartrate  75 mg Oral BID  . nicotine  21 mg Transdermal Daily  . pantoprazole (PROTONIX) IV  40 mg Intravenous QHS  . piperacillin-tazobactam (ZOSYN)  IV  3.375 g Intravenous Q8H  . potassium chloride  20 mEq Oral Once  . rosuvastatin  20 mg Oral QHS  . thiamine  100 mg Oral Daily  . DISCONTD: insulin aspart  0-15 Units Subcutaneous TID WC  . DISCONTD: insulin aspart  0-5 Units Subcutaneous QHS  . DISCONTD: piperacillin-tazobactam (ZOSYN)  IV  3.375 g Intravenous Q6H     Ht: 6\' 2"  (188 cm)  Wt: 206 lb (93.441 kg)  Ideal Wt: 86.4 kg  % Ideal Wt: 108%  Usual Wt: 220-230 lbs % Usual Wt: 93% of 200 lbs  Body mass index is 26.45 kg/(m^2). Overweight  Food/Nutrition Related Hx: Patient reports he as been losing weight for several months since he was sick with pneumonia. Loss of appetite is still ongoing.    Labs:  CMP     Component Value Date/Time   NA 132* 01/18/2011 2009   K 3.0* 01/18/2011 2009   CL 102 01/18/2011 2009   CO2 22 01/18/2011 2009   GLUCOSE 222*  01/18/2011 2009   BUN 6 01/18/2011 2009   CREATININE 0.85 01/18/2011 2009   CALCIUM 7.9* 01/18/2011 2009   PROT 6.0 01/18/2011 2009   ALBUMIN 1.7* 01/18/2011 2009   AST 13 01/18/2011 2009   ALT 8 01/18/2011 2009   ALKPHOS 55 01/18/2011 2009   BILITOT 0.5 01/18/2011 2009   GFRNONAA 88* 01/18/2011 2009   GFRAA >90 01/18/2011 2009   CBG (last 3)   Basename 01/19/11 0606 01/18/11 2118 01/18/11 1653  GLUCAP 196* 232* 199*     I/O last 3 completed shifts: In: 650 [P.O.:600; IV Piggyback:50] Out: -    Diet Order: NPO  Supplements/Tube Feeding: none  IVF: saline lock  Estimated Nutritional Needs:   Kcal: 2100-2300 Protein: 75-95 gm  Fluid:>2 L  Patient is NPO now for surgery. Previous diet, carb mod po intake of only 25%. Likely will need supplements post surgery to maintain weight and protein status. Patient states he does not like milk based drinks, would prefer juice. Patient has DM with high cbg's. RD will likely have to provide Pro-stat. Weight loss based on UWB of 220 lbs and current weight 206 lbs does not meet the criteria for malnutrition. Unable to find  any documented weights from previous admissions or office visits.    NUTRITION DIAGNOSIS: -Inadequate oral intake (NI-2.1).  Status: Ongoing  RELATED TO: poor appetite, no po intake  AS EVIDENCE BY: 25% meals po, new NPO status  MONITORING/EVALUATION(Goals): Goal: PO intake will resume after surgery and meet >90% of estimated nutrition needs Monitor: PO intake, weights, labs, I/O's, supplements  EDUCATION NEEDS: -No education needs identified at this time  INTERVENTION: 1. Once patient diet has advanced after surgery, RD will ad supplements as appropriate 2. RD to follow  Dietitian [phone]  DOCUMENTATION CODES Per approved criteria  -Not Applicable    Clarene Duke MARIE 01/19/2011, 10:23 AM

## 2011-01-19 NOTE — Anesthesia Procedure Notes (Signed)
Procedure Name: Intubation Date/Time: 01/19/2011 1:18 PM Performed by: Fuller Canada Pre-anesthesia Checklist: Patient identified, Timeout performed, Emergency Drugs available, Suction available and Patient being monitored Patient Re-evaluated:Patient Re-evaluated prior to inductionOxygen Delivery Method: Circle System Utilized Preoxygenation: Pre-oxygenation with 100% oxygen Intubation Type: IV induction Ventilation: Mask ventilation without difficulty Laryngoscope Size: Mac and 3 Grade View: Grade I Endobronchial tube: Left, Double lumen EBT, EBT position confirmed by fiberoptic bronchoscope and EBT position confirmed by auscultation and 39 Fr Number of attempts: 1 Airway Equipment and Method: stylet Tube secured with: Tape Dental Injury: Teeth and Oropharynx as per pre-operative assessment

## 2011-01-19 NOTE — Plan of Care (Signed)
Pt agitated and attempting to pull out ET tube.  Attempts to redirect patient unsuccessful.  Precedex infusing.   MD notifed.

## 2011-01-19 NOTE — Brief Op Note (Signed)
01/18/2011 - 01/19/2011  3:14 PM  PATIENT:  Karena Addison  71 y.o. male  PRE-OPERATIVE DIAGNOSIS:  Left empyema  POST-OPERATIVE DIAGNOSIS:  Left empyema  PROCEDURE:  Procedure(s): LEFT VIDEO ASSISTED THORACOSCOPY, LEFT MINI-THORACOTOMY, DRAINAGE OF EMPYEMA, DECORTICATION, VIDEO BRONCHOSCOPY  SURGEON:  Surgeon(s): [name], MD  ASSISTANT: Coral Ceo, PA-C  ANESTHESIA:   general  SPECIMEN:  Source of Specimen:  Left pleural peel, left empyema  DISPOSITION OF SPECIMEN:  Pathology  DRAINS: 1 36 Fr CT, 1 28 and 1 32 Blake drain  PATIENT CONDITION:  ICU - intubated and hemodynamically stable.

## 2011-01-19 NOTE — Progress Notes (Signed)
Pt potassium drop to 3.0. Called md Tyrone Sage and made him aware. Orders were given. Will continue to monitor.

## 2011-01-19 NOTE — Anesthesia Preprocedure Evaluation (Addendum)
Anesthesia Evaluation  Patient identified by MRN, date of birth, ID band Patient awake    Reviewed: Allergy & Precautions, H&P , NPO status , Patient's Chart, lab work & pertinent test results  Airway Mallampati: I TM Distance: >3 FB Neck ROM: full    Dental  (+) Poor Dentition and Partial Upper   Pulmonary COPDCurrent Smoker,  + rhonchi  + decreased breath sounds      Cardiovascular hypertension, regular Normal    Neuro/Psych Negative Neurological ROS     GI/Hepatic Neg liver ROS, GERD-  ,  Endo/Other  Diabetes mellitus-, Type 2, Oral Hypoglycemic Agents  Renal/GU negative Renal ROS  Genitourinary negative   Musculoskeletal   Abdominal   Peds  Hematology negative hematology ROS (+)   Anesthesia Other Findings   Reproductive/Obstetrics                          Anesthesia Physical Anesthesia Plan  ASA: III  Anesthesia Plan: General ETT   Post-op Pain Management:    Induction: Intravenous  Airway Management Planned: Double Lumen EBT  Additional Equipment: Arterial line and CVP  Intra-op Plan:   Post-operative Plan: Extubation in OR  Informed Consent: I have reviewed the patients History and Physical, chart, labs and discussed the procedure including the risks, benefits and alternatives for the proposed anesthesia with the patient or authorized representative who has indicated his/her understanding and acceptance.     Plan Discussed with: Surgeon, CRNA and Anesthesiologist  Anesthesia Plan Comments:        Anesthesia Quick Evaluation

## 2011-01-20 ENCOUNTER — Inpatient Hospital Stay (HOSPITAL_COMMUNITY): Payer: Medicare (Managed Care)

## 2011-01-20 ENCOUNTER — Encounter (HOSPITAL_COMMUNITY): Payer: Self-pay | Admitting: Cardiothoracic Surgery

## 2011-01-20 DIAGNOSIS — E1165 Type 2 diabetes mellitus with hyperglycemia: Secondary | ICD-10-CM

## 2011-01-20 DIAGNOSIS — IMO0001 Reserved for inherently not codable concepts without codable children: Secondary | ICD-10-CM

## 2011-01-20 LAB — POCT I-STAT 3, ART BLOOD GAS (G3+)
Acid-base deficit: 3 mmol/L — ABNORMAL HIGH (ref 0.0–2.0)
Acid-base deficit: 4 mmol/L — ABNORMAL HIGH (ref 0.0–2.0)
Bicarbonate: 22.9 mEq/L (ref 20.0–24.0)
O2 Saturation: 93 %
O2 Saturation: 94 %
Patient temperature: 97.8
Patient temperature: 97.8
Patient temperature: 98
TCO2: 24 mmol/L (ref 0–100)
TCO2: 23 mmol/L (ref 0–100)
TCO2: 22 mmol/L (ref 0–100)
pCO2 arterial: 35.1 mmHg (ref 35.0–45.0)
pH, Arterial: 7.423 (ref 7.350–7.450)

## 2011-01-20 LAB — GLUCOSE, CAPILLARY
Glucose-Capillary: 255 mg/dL — ABNORMAL HIGH (ref 70–99)
Glucose-Capillary: 193 mg/dL — ABNORMAL HIGH (ref 70–99)

## 2011-01-20 LAB — POCT I-STAT 4, (NA,K, GLUC, HGB,HCT): Sodium: 137 mEq/L (ref 135–145)

## 2011-01-20 LAB — CBC
MCV: 79.7 fL (ref 78.0–100.0)
Platelets: 254 10*3/uL (ref 150–400)
RBC: 3.25 MIL/uL — ABNORMAL LOW (ref 4.22–5.81)
WBC: 12.8 10*3/uL — ABNORMAL HIGH (ref 4.0–10.5)

## 2011-01-20 LAB — BASIC METABOLIC PANEL
CO2: 23 mEq/L (ref 19–32)
Chloride: 105 mEq/L (ref 96–112)
Creatinine, Ser: 0.79 mg/dL (ref 0.50–1.35)
GFR calc Af Amer: >90 mL/min (ref >90)
Potassium: 3.6 mEq/L (ref 3.5–5.1)
Sodium: 133 mEq/L — ABNORMAL LOW (ref 135–145)

## 2011-01-20 LAB — CULTURE, RESPIRATORY W GRAM STAIN

## 2011-01-20 MED ORDER — OXYCODONE HCL 5 MG PO TABS
5.0000 mg | ORAL_TABLET | ORAL | Status: DC | PRN
  Administered 2011-01-20 – 2011-01-30 (×19): 5 mg via ORAL
  Filled 2011-01-20 (×8): qty 1
  Filled 2011-01-20: qty 2
  Filled 2011-01-20 (×6): qty 1
  Filled 2011-01-20: qty 2
  Filled 2011-01-20 (×2): qty 1

## 2011-01-20 MED ORDER — ALBUTEROL SULFATE (5 MG/ML) 0.5% IN NEBU
2.5000 mg | INHALATION_SOLUTION | Freq: Four times a day (QID) | RESPIRATORY_TRACT | Status: DC
  Administered 2011-01-21 – 2011-01-25 (×20): 2.5 mg via RESPIRATORY_TRACT
  Filled 2011-01-20 (×20): qty 0.5

## 2011-01-20 MED ORDER — INSULIN GLARGINE 100 UNIT/ML ~~LOC~~ SOLN
20.0000 [IU] | Freq: Every day | SUBCUTANEOUS | Status: DC
  Filled 2011-01-20: qty 3

## 2011-01-20 MED ORDER — MORPHINE BOLUS VIA INFUSION: 2.0000 mg | INTRAVENOUS | Status: DC | PRN

## 2011-01-20 MED ORDER — CHLORHEXIDINE GLUCONATE 0.12 % MT SOLN
OROMUCOSAL | Status: AC
  Administered 2011-01-20: 15 mL
  Filled 2011-01-20: qty 15

## 2011-01-20 MED ORDER — POTASSIUM CHLORIDE 10 MEQ/50ML IV SOLN
10.0000 meq | INTRAVENOUS | Status: AC | PRN
  Administered 2011-01-20 (×3): 10 meq via INTRAVENOUS

## 2011-01-20 MED ORDER — POTASSIUM CHLORIDE IN NACL 20-0.9 MEQ/L-% IV SOLN
INTRAVENOUS | Status: DC
  Administered 2011-01-20 – 2011-01-21 (×2): via INTRAVENOUS
  Filled 2011-01-20 (×2): qty 1000

## 2011-01-20 MED ORDER — SODIUM CHLORIDE 0.9 % IV SOLN: INTRAVENOUS | Status: DC

## 2011-01-20 MED ORDER — POTASSIUM CHLORIDE 10 MEQ/50ML IV SOLN
INTRAVENOUS | Status: AC
  Administered 2011-01-20: 10 meq via INTRAVENOUS
  Filled 2011-01-20: qty 100

## 2011-01-20 MED ORDER — LACTATED RINGERS IV SOLN: INTRAVENOUS | Status: DC

## 2011-01-20 MED ORDER — ENOXAPARIN SODIUM 40 MG/0.4ML ~~LOC~~ SOLN
40.0000 mg | SUBCUTANEOUS | Status: DC
  Administered 2011-01-21 – 2011-01-29 (×10): 40 mg via SUBCUTANEOUS
  Filled 2011-01-20 (×11): qty 0.4

## 2011-01-20 MED ORDER — PRO-STAT SUGAR FREE PO LIQD
30.0000 mL | Freq: Two times a day (BID) | ORAL | Status: DC
  Filled 2011-01-20 (×6): qty 30

## 2011-01-20 MED ORDER — MORPHINE SULFATE 2 MG/ML IJ SOLN
2.0000 mg | INTRAMUSCULAR | Status: DC | PRN
  Administered 2011-01-20: 2 mg via INTRAVENOUS
  Filled 2011-01-20: qty 1

## 2011-01-20 MED ORDER — FUROSEMIDE 10 MG/ML IJ SOLN
40.0000 mg | Freq: Once | INTRAMUSCULAR | Status: AC
  Administered 2011-01-21: 40 mg via INTRAVENOUS
  Filled 2011-01-20: qty 4

## 2011-01-20 NOTE — Anesthesia Postprocedure Evaluation (Signed)
  Anesthesia Post-op Note  Patient: Oscar [name]  Procedure(s) Performed:  VIDEO ASSISTED THORACOSCOPY; VIDEO BRONCHOSCOPY  Patient Location: SICU  Anesthesia Type: General  Level of Consciousness: awake, alert , oriented and patient cooperative  Airway and Oxygen Therapy: Patient Spontanous Breathing  Post-op Pain: mild  Post-op Assessment: Post-op Vital signs reviewed, Patient's Cardiovascular Status Stable, Respiratory Function Stable, Patent Airway, No signs of Nausea or vomiting, Adequate PO intake and Pain level controlled  Post-op Vital Signs: Reviewed and stable  Complications: No apparent anesthesia complications

## 2011-01-20 NOTE — Progress Notes (Signed)
Pt extubated by RT per MD order to 4L [place].  Patient VS remain stable and tolerated well.  Pt alert and oriented and resting.  Nursing will continue to monitor.

## 2011-01-20 NOTE — Progress Notes (Signed)
1 Day Post-Op Procedure(s) (LRB): VIDEO ASSISTED THORACOSCOPY (Left) VIDEO BRONCHOSCOPY (N/A) Subjective:                   *                                           [address]            [place] [address]          [phone]    POD #1 L VATS decort on vent   Objective: Vital signs in last 24 hours: Temp:  [97.5 F (36.4 C)-98.4 F (36.9 C)] 97.8 F (36.6 C) (01/11 0726) Pulse Rate:  [65-95] 79  (01/11 0700) Cardiac Rhythm:  [-] Normal sinus rhythm (01/11 0000) Resp:  [12-22] 12  (01/11 0700) BP: (74-112)/(47-70) 83/57 mmHg (01/11 0700) SpO2:  [91 %-100 %] 98 % (01/11 0804) Arterial Line BP: (76-115)/(36-62) 105/50 mmHg (01/11 0700) FiO2 (%):  [40 %-50.7 %] 40 % (01/11 0804) Weight:  [483 lb 11 oz (219.4 kg)] 483 lb 11 oz (219.4 kg) (01/11 0400)  Hemodynamic parameters for last 24 hours: CVP:  [7 mmHg-11 mmHg] 7 mmHg  Intake/Output from previous day: 01/10 0701 - 01/11 0700 In: 6598 [I.V.:5275.5; NG/GT:60; IV Piggyback:1262.5] Out: 2425 [Urine:1365; Blood:300; Chest Tube:760] Intake/Output this shift:    EXAM   Coarse rales NSR BP 110 on low dose NEO  Lab Results:  Basename 01/20/11 0340 01/19/11 2140  WBC 12.8* 19.0*  HGB 9.2* 10.5*  HCT 25.9* 29.9*  PLT 254 274   BMET:  Basename 01/20/11 0340 01/18/11 2009  NA 133* 132*  K 3.6 3.0*  CL 105 102  CO2 23 22  GLUCOSE 254* 222*  BUN 8 6  CREATININE 0.79 0.85  CALCIUM 7.2* 7.9*    PT/INR:  Basename 01/19/11 0500  LABPROT 16.2*  INR 1.27   ABG    Component Value Date/Time   PHART 7.423 01/20/2011 0337   HCO3 22.9 01/20/2011 0337   TCO2 24 01/20/2011 0337   ACIDBASEDEF 1.0 01/20/2011 0337   O2SAT 98.0 01/20/2011 0337   CBG (last 3)   Basename 01/20/11 0724 01/20/11 0343 01/19/11 2347  GLUCAP 201* 255* 298*    Assessment/Plan: S/P Procedure(s) (LRB): VIDEO ASSISTED THORACOSCOPY (Left) VIDEO BRONCHOSCOPY (N/A) Extubate   LOS: 2 days    VAN TRIGT III,PETER 01/20/2011

## 2011-01-20 NOTE — Anesthesia Postprocedure Evaluation (Signed)
  Anesthesia Post-op Note  Patient: Oscar [name]  Procedure(s) Performed:  VIDEO ASSISTED THORACOSCOPY; VIDEO BRONCHOSCOPY  Patient Location: PACU  Anesthesia Type: General  Level of Consciousness: awake  Airway and Oxygen Therapy: Patient Spontanous Breathing  Post-op Pain: mild  Post-op Assessment: Post-op Vital signs reviewed  Post-op Vital Signs: stable  Complications: No apparent anesthesia complications

## 2011-01-20 NOTE — Progress Notes (Signed)
Nutrition Brief Follow Up Note:  Patient seen on 1/10 by RD. Diet has advanced to [employer]. PO intake 0%. New recorded weight is inaccurate, yesterday weight was 206 lbs, today 483 lbs. As per addressed in initial note, RD will add Pro-stat BID to increase protein intake to promote healing. Chart reviewed, will follow.   CBG (last 3)   Basename 01/20/11 1201 01/20/11 0724 01/20/11 0343  GLUCAP 153* 201* 255*    CMP     Component Value Date/Time   NA 133* 01/20/2011 0340   K 3.6 01/20/2011 0340   CL 105 01/20/2011 0340   CO2 23 01/20/2011 0340   GLUCOSE 254* 01/20/2011 0340   BUN 8 01/20/2011 0340   CREATININE 0.79 01/20/2011 0340   CALCIUM 7.2* 01/20/2011 0340   PROT 6.0 01/18/2011 2009   ALBUMIN 1.7* 01/18/2011 2009   AST 13 01/18/2011 2009   ALT 8 01/18/2011 2009   ALKPHOS 55 01/18/2011 2009   BILITOT 0.5 01/18/2011 2009   GFRNONAA >90 01/20/2011 0340   GFRAA >90 01/20/2011 0340   Gwen Pounds, Shreeya Recendiz MARIE #[phone]

## 2011-01-20 NOTE — Op Note (Signed)
[name], [name] NO.:  [id number]  MEDICAL RECORD NO.:  [id number]  LOCATION:  2306                         FACILITY:  [facility]  PHYSICIAN:  Kerin Perna, M.D.  DATE OF BIRTH:  1940-03-27  DATE OF PROCEDURE:  01/19/2011 DATE OF DISCHARGE:                              OPERATIVE REPORT   OPERATIONS: 1. Left video-assisted thoracoscopic surgery, main thoracotomy with     drainage of left empyema and decortication of left lung. 2. Video bronchoscopy. 3. Placement of wound On-Q pain irrigation system.  SURGEON:  Kerin Perna, MD  ASSISTANT:  Coral Ceo, P A-C  ANESTHESIA:  General by Dr. Judie Petit.  PREOPERATIVE DIAGNOSES:  Left lower lobe pneumonia with empyema, sepsis.  POSTOPERATIVE DIAGNOSES:  Left lower lobe pneumonia with empyema, sepsis.  INDICATIONS:  The patient is a chronically ill 71 year old Caucasian male, ex-smoker, transferred from [facility] with a large loculated empyema and sepsis.  He had undergone thoracentesis with rapid reaccumulation of the fluid.  I discussed left VATS decortication and drainage of empyema with the patient including the details of surgery, expected recovery, and the risks and he understood and agreed to proceed with surgery.  PROCEDURE:  The patient was brought directly to the operative room where general anesthesia was induced with a double-lumen endotracheal tube. The patient was turned to expose left chest.  After proper time-out, the patient was prepped and draped as a sterile field.  A small incision was made at the tip of the scapula and the fifth interspace and pleural fluid was immediately drained.  This was sent for culture and cytology. The VATS camera was inserted.  There was poor visibility due to multiloculated collections and heavy shaggy pleural peel.  The VATS scope was removed and the incision was extended approximately 8 cm.  The ribs were gently spread, but not divided.  The  pleural space was then dissected out.  There was a large thick peel on the lower lobe as well as the lingula of the upper lobe.  This was removed on the parietal pleural surface.  This was removed on the visceral pleural surface. There was also a large parietal pleural peel and this was removed around the left lung cavity.  There was a loculated fluid inferiorly on top of the diaphragm as well as superiorly.  There was a loculated milky fluid anteriorly along the pericardium and all these areas and spaces and loculations were opened completely.  The peel was completely removed from the lung.  The area of the bronchopleural fissure was identified and cauterized and closed with a figure-of-eight 3-0 chromic suture. This was in the basilar lower lobe segment.  The pleural space was irrigated with warm saline irrigation.  Chest tubes were placed anteriorly and posteriorly as well as in the costophrenic space and brought out through separate incisions.  The lung was reexpanded completely under direct vision.  The ribs were reapproximated with #2 Vicryl.  The muscle layers were closed in layers using #1 Vicryl. Subcutaneous was closed with running Vicryl and the skin was closed with staples.  An On-Q catheter was placed between the chest tubes and the incision, flushed with  0.5% Marcaine, and secured with silk suture.  Sterile dressing was applied.  The patient was then turned supine and double-lumen tube was exchanged for single-lumen tube.  Through the single-lumen tube, a video bronchoscope was passed.  The distal trachea and carina were sharp and normal.  The left mainstem bronchus had some cloudy secretions and these were completely cleared and irrigated.  There were endobronchial masses noted in the left upper lobe or left lower lobe segments.  The bronchoscope was then used to examine the right mainstem bronchus and the segments of the right upper lobe, right middle lobe, and right  lower lobe.  There were no endobronchial lesions noted.  The bronchoscope was then withdrawn.  Washings from the left lung were sent for both culture and cytology.  The patient was then directed to the intensive care unit while being intubated in critical, but stable condition.     Kerin Perna, M.D.     PV/MEDQ  D:  01/19/2011  T:  01/20/2011  Job:  [id number]

## 2011-01-20 NOTE — Procedures (Signed)
Extubation Procedure Note  Patient Details:   Name: Oscar [name] DOB: [birth year] MRN: [id number]      Evaluation  O2 sats: stable throughout Complications: No apparent complications Patient did tolerate procedure well. Bilateral Breath Sounds: Fine crackles   Yes  Pt extubated per protocol.  NIF-30, VC 750 ml.  Positive leak test noted.  Placed on 4l [place].  Pt able to vocalize.  Pt tolerated well. Lysbeth Penner [facility] 01/20/2011, 9:15 AM

## 2011-01-20 NOTE — Progress Notes (Signed)
UR Completed.  Oscar [name] Jane [phone] 01/20/2011  

## 2011-01-21 ENCOUNTER — Inpatient Hospital Stay (HOSPITAL_COMMUNITY): Payer: Medicare (Managed Care)

## 2011-01-21 LAB — COMPREHENSIVE METABOLIC PANEL
ALT: 6 U/L (ref 0–53)
Alkaline Phosphatase: 51 U/L (ref 39–117)
BUN: 6 mg/dL (ref 6–23)
CO2: 25 mEq/L (ref 19–32)
Calcium: 7.7 mg/dL — ABNORMAL LOW (ref 8.4–10.5)
GFR calc Af Amer: >90 mL/min (ref >90)
GFR calc non Af Amer: >90 mL/min (ref >90)
Glucose, Bld: 179 mg/dL — ABNORMAL HIGH (ref 70–99)
Potassium: 3.4 mEq/L — ABNORMAL LOW (ref 3.5–5.1)
Sodium: 134 mEq/L — ABNORMAL LOW (ref 135–145)

## 2011-01-21 LAB — GLUCOSE, CAPILLARY
Glucose-Capillary: 134 mg/dL — ABNORMAL HIGH (ref 70–99)
Glucose-Capillary: 136 mg/dL — ABNORMAL HIGH (ref 70–99)
Glucose-Capillary: 163 mg/dL — ABNORMAL HIGH (ref 70–99)
Glucose-Capillary: 170 mg/dL — ABNORMAL HIGH (ref 70–99)

## 2011-01-21 LAB — CBC
HCT: 30.2 % — ABNORMAL LOW (ref 39.0–52.0)
Hemoglobin: 10.6 g/dL — ABNORMAL LOW (ref 13.0–17.0)
MCH: 28.1 pg (ref 26.0–34.0)
RBC: 3.77 MIL/uL — ABNORMAL LOW (ref 4.22–5.81)

## 2011-01-21 LAB — MRSA PCR SCREENING: MRSA by PCR: NEGATIVE

## 2011-01-21 MED ORDER — INSULIN GLARGINE 100 UNIT/ML ~~LOC~~ SOLN
24.0000 [IU] | Freq: Every day | SUBCUTANEOUS | Status: DC
  Administered 2011-01-21: 24 [IU] via SUBCUTANEOUS
  Filled 2011-01-21: qty 3

## 2011-01-21 MED ORDER — INSULIN ASPART 100 UNIT/ML ~~LOC~~ SOLN
0.0000 [IU] | Freq: Three times a day (TID) | SUBCUTANEOUS | Status: DC
  Administered 2011-01-21: 2 [IU] via SUBCUTANEOUS
  Administered 2011-01-22: 12 [IU] via SUBCUTANEOUS
  Administered 2011-01-22 – 2011-01-23 (×3): 2 [IU] via SUBCUTANEOUS
  Administered 2011-01-23: 8 [IU] via SUBCUTANEOUS
  Administered 2011-01-24 – 2011-01-25 (×5): 2 [IU] via SUBCUTANEOUS
  Administered 2011-01-25 – 2011-01-26 (×2): 8 [IU] via SUBCUTANEOUS
  Administered 2011-01-26: 2 [IU] via SUBCUTANEOUS
  Administered 2011-01-26: 20 [IU] via SUBCUTANEOUS
  Administered 2011-01-27: 2 [IU] via SUBCUTANEOUS
  Administered 2011-01-27 – 2011-01-28 (×3): 8 [IU] via SUBCUTANEOUS
  Administered 2011-01-28 – 2011-01-29 (×2): 4 [IU] via SUBCUTANEOUS
  Administered 2011-01-29: 8 [IU] via SUBCUTANEOUS
  Administered 2011-01-29: 2 [IU] via SUBCUTANEOUS
  Administered 2011-01-30: 12 [IU] via SUBCUTANEOUS
  Administered 2011-01-30: 4 [IU] via SUBCUTANEOUS
  Filled 2011-01-21 (×2): qty 3

## 2011-01-21 MED ORDER — INSULIN ASPART 100 UNIT/ML ~~LOC~~ SOLN
0.0000 [IU] | SUBCUTANEOUS | Status: DC
  Filled 2011-01-21: qty 3

## 2011-01-21 MED ORDER — PANTOPRAZOLE SODIUM 40 MG PO TBEC
40.0000 mg | DELAYED_RELEASE_TABLET | Freq: Every day | ORAL | Status: DC
  Administered 2011-01-21 – 2011-01-30 (×10): 40 mg via ORAL
  Filled 2011-01-21 (×10): qty 1

## 2011-01-21 MED ORDER — POTASSIUM CHLORIDE 10 MEQ/50ML IV SOLN
10.0000 meq | INTRAVENOUS | Status: AC | PRN
  Administered 2011-01-21 (×3): 10 meq via INTRAVENOUS

## 2011-01-21 MED ORDER — FUROSEMIDE 10 MG/ML IJ SOLN
40.0000 mg | Freq: Once | INTRAMUSCULAR | Status: AC
  Administered 2011-01-21: 40 mg via INTRAVENOUS
  Filled 2011-01-21: qty 4

## 2011-01-21 MED ORDER — FUROSEMIDE 10 MG/ML IJ SOLN: 40.0000 mg | Freq: Once | INTRAMUSCULAR | Status: DC

## 2011-01-22 ENCOUNTER — Inpatient Hospital Stay (HOSPITAL_COMMUNITY): Payer: Medicare (Managed Care)

## 2011-01-22 LAB — CBC
HCT: 27.1 % — ABNORMAL LOW (ref 39.0–52.0)
Hemoglobin: 9.4 g/dL — ABNORMAL LOW (ref 13.0–17.0)
MCH: 28.1 pg (ref 26.0–34.0)
MCHC: 34.7 g/dL (ref 30.0–36.0)
MCV: 80.9 fL (ref 78.0–100.0)
Platelets: 283 K/uL (ref 150–400)
RBC: 3.35 MIL/uL — ABNORMAL LOW (ref 4.22–5.81)
RDW: 13.2 % (ref 11.5–15.5)
WBC: 11.7 K/uL — ABNORMAL HIGH (ref 4.0–10.5)

## 2011-01-22 LAB — BASIC METABOLIC PANEL WITH GFR
BUN: 7 mg/dL (ref 6–23)
CO2: 29 meq/L (ref 19–32)
Calcium: 7.5 mg/dL — ABNORMAL LOW (ref 8.4–10.5)
Chloride: 102 meq/L (ref 96–112)
Creatinine, Ser: 0.98 mg/dL (ref 0.50–1.35)
GFR calc Af Amer: >90 mL/min (ref >90)
GFR calc non Af Amer: 83 mL/min — ABNORMAL LOW (ref >90)
Glucose, Bld: 87 mg/dL (ref 70–99)
Potassium: 3.1 meq/L — ABNORMAL LOW (ref 3.5–5.1)
Sodium: 136 meq/L (ref 135–145)

## 2011-01-22 LAB — GLUCOSE, CAPILLARY
Glucose-Capillary: 147 mg/dL — ABNORMAL HIGH (ref 70–99)
Glucose-Capillary: 254 mg/dL — ABNORMAL HIGH (ref 70–99)
Glucose-Capillary: 140 mg/dL — ABNORMAL HIGH (ref 70–99)

## 2011-01-22 MED ORDER — METFORMIN HCL 500 MG PO TABS
500.0000 mg | ORAL_TABLET | Freq: Two times a day (BID) | ORAL | Status: DC
  Administered 2011-01-22 – 2011-01-30 (×18): 500 mg via ORAL
  Filled 2011-01-22 (×19): qty 1

## 2011-01-22 MED ORDER — ENSURE PUDDING PO PUDG
1.0000 | Freq: Three times a day (TID) | ORAL | Status: DC
  Administered 2011-01-22 – 2011-01-29 (×16): 1 via ORAL

## 2011-01-22 MED ORDER — METOPROLOL TARTRATE 50 MG PO TABS
50.0000 mg | ORAL_TABLET | Freq: Two times a day (BID) | ORAL | Status: DC
  Filled 2011-01-22: qty 1

## 2011-01-22 MED ORDER — VANCOMYCIN HCL IN DEXTROSE 1-5 GM/200ML-% IV SOLN
1000.0000 mg | Freq: Two times a day (BID) | INTRAVENOUS | Status: DC
  Administered 2011-01-22 – 2011-01-29 (×15): 1000 mg via INTRAVENOUS
  Filled 2011-01-22 (×16): qty 200

## 2011-01-22 MED ORDER — POTASSIUM CHLORIDE CRYS ER 20 MEQ PO TBCR
40.0000 meq | EXTENDED_RELEASE_TABLET | Freq: Once | ORAL | Status: AC
  Administered 2011-01-22: 40 meq via ORAL
  Filled 2011-01-22: qty 2

## 2011-01-22 MED ORDER — FUROSEMIDE 10 MG/ML IJ SOLN
40.0000 mg | Freq: Every day | INTRAMUSCULAR | Status: DC
  Administered 2011-01-22 – 2011-01-24 (×3): 40 mg via INTRAVENOUS
  Filled 2011-01-22 (×5): qty 4

## 2011-01-22 MED ORDER — SODIUM CHLORIDE 0.9 % IJ SOLN
10.0000 mL | Freq: Two times a day (BID) | INTRAMUSCULAR | Status: DC
  Administered 2011-01-22 – 2011-01-30 (×17): 10 mL via INTRAVENOUS
  Filled 2011-01-22: qty 10
  Filled 2011-01-22: qty 20
  Filled 2011-01-22: qty 10
  Filled 2011-01-22 (×2): qty 20
  Filled 2011-01-22: qty 10
  Filled 2011-01-22 (×3): qty 20
  Filled 2011-01-22: qty 10

## 2011-01-22 MED ORDER — PHENOL 1.4 % MT LIQD
1.0000 | OROMUCOSAL | Status: DC | PRN
  Administered 2011-01-22: 1 via OROMUCOSAL
  Filled 2011-01-22: qty 177

## 2011-01-22 MED ORDER — SODIUM CHLORIDE 0.9 % IJ SOLN
INTRAMUSCULAR | Status: AC
  Filled 2011-01-22: qty 10

## 2011-01-22 MED ORDER — METOPROLOL TARTRATE 25 MG PO TABS
25.0000 mg | ORAL_TABLET | Freq: Two times a day (BID) | ORAL | Status: DC
  Administered 2011-01-22 – 2011-01-30 (×14): 25 mg via ORAL
  Filled 2011-01-22 (×17): qty 1

## 2011-01-22 MED ORDER — TRAMADOL HCL 50 MG PO TABS
50.0000 mg | ORAL_TABLET | Freq: Four times a day (QID) | ORAL | Status: DC | PRN
  Administered 2011-01-22 – 2011-01-23 (×2): 100 mg via ORAL
  Filled 2011-01-22 (×2): qty 2

## 2011-01-22 MED ORDER — POTASSIUM CHLORIDE CRYS ER 20 MEQ PO TBCR
20.0000 meq | EXTENDED_RELEASE_TABLET | Freq: Once | ORAL | Status: AC
  Administered 2011-01-22: 20 meq via ORAL
  Filled 2011-01-22: qty 1

## 2011-01-22 NOTE — Progress Notes (Signed)
Dr. Donata Clay notified of pt's heart rate, rhythm and BPs. Parameters changed for metoprolol. Renette Butters, Viona Gilmore

## 2011-01-22 NOTE — Progress Notes (Signed)
CRITICAL VALUE ALERT  Critical value received:  MRSA in pleural fluid   Date of notification:  01-22-11  Time of notification:  0940  Critical value read back:yes  Nurse who received alert:  Claria Dice RN  MD notified (1st page):  [phone]   Time of first page:  0950  MD notified (2nd page):  Time of second page:  Responding MD:  Dr. Donata Clay  Time MD responded:  [phone]

## 2011-01-22 NOTE — Progress Notes (Addendum)
3 Days Post-Op Procedure(s) (LRB): VIDEO ASSISTED THORACOSCOPY (Left) VIDEO BRONCHOSCOPY (N/A)  Subjective: Patient with productive cough. Otherwise, doing ok.  Objective: Vital signs in last 24 hours: Patient Vitals for the past 24 hrs:  BP Temp Temp src Pulse Resp SpO2  01/22/11 1020 96/55 mmHg - - 93  - -  01/22/11 0900 - - - - - 94 %  01/22/11 0739 110/73 mmHg 98.2 F (36.8 C) Oral 96  30  94 %  01/22/11 0300 106/68 mmHg 98.6 F (37 C) Oral 89  27  94 %  01/21/11 2300 110/96 mmHg 98.3 F (36.8 C) Oral 92  27  94 %  01/21/11 2105 - - - - - 94 %  01/21/11 2031 95/50 mmHg 98.6 F (37 C) Oral 108  26  94 %  01/21/11 1628 117/96 mmHg 98.7 F (37.1 C) Oral 90  20  92 %  01/21/11 1600 - - - - - 87 %  01/21/11 1335 - 98 F (36.7 C) Oral - - -  01/21/11 1137 - 98.4 F (36.9 C) Oral - - -  01/21/11 1129 - - - - - 93 %    Current Weight  01/21/11 214 lb 8 oz (97.297 kg)    Hemodynamic parameters for last 24 hours:    Intake/Output from previous day: 01/12 0701 - 01/13 0700 In: 610 [P.O.:240; I.V.:120; IV Piggyback:250] Out: 3775 [Urine:3575; Chest Tube:200]   Physical Exam:  Cardiovascular: RRR, no murmurs, gallops, or rubs. Pulmonary: Clear to auscultation bilaterally; no rales, wheezes, or rhonchi. Abdomen: Soft, non tender, bowel sounds present. Extremities: Mild bilateral lower extremity edema. Wounds: Clean and dry.  No erythema or signs of infection.  Lab Results: CBC: Basename 01/22/11 0356 01/21/11 0412  WBC 11.7* 16.3*  HGB 9.4* 10.6*  HCT 27.1* 30.2*  PLT 283 302   BMET:  Basename 01/22/11 0356 01/21/11 0412  NA 136 134*  K 3.1* 3.4*  CL 102 103  CO2 29 25  GLUCOSE 87 179*  BUN 7 6  CREATININE 0.98 0.79  CALCIUM 7.5* 7.7*    PT/INR: No results found for this basename: LABPROT,INR in the last 72 hours ABG:  INR: Will add last result for INR, ABG once components are confirmed Will add last 4 CBG results once components are  confirmed  Assessment/Plan:  1. CV - Previous NSTEMI. On Lopressor 75 bid but SBP marginal this am. Will decrease to 25 bid. Cardiology to follow. 2.  Pulmonary - Encourage incentive spirometer and flutter valve.CXR this am shows no pneumothorax, bilateral airspace disease (prob left pleural effusion).Chest tube with +1 air leak. CT to remain. Check CXR in am. 3.Leukocytosis-WBC continues to decrease 16.3 to 11.7. Remains afebrile. 4.  Acute blood loss anemia - H/H this am 9.4/27.1 5.Supplement Potassium (given 3 runs), but will order additional po as was 3.1 this am. 6.DM-CBGs 136/134/147. Continue Glipizide. Will restart Metformin. Discontinue schedule Lantus 7.Continue Zosyn and Vancomycin (history of PNA and s/p drainage of left empyema). 8.Consider Lasix.    ZIMMERMAN,DONIELLE MPA-C 01/22/2011   patient examined and medical record reviewed,agree with above note. Pleural fluid + for MRSA will add VANC for 2 wks -- may need PICC later VAN TRIGT III,PETER 01/22/2011

## 2011-01-23 ENCOUNTER — Inpatient Hospital Stay (HOSPITAL_COMMUNITY): Payer: Medicare (Managed Care)

## 2011-01-23 DIAGNOSIS — E1165 Type 2 diabetes mellitus with hyperglycemia: Secondary | ICD-10-CM

## 2011-01-23 DIAGNOSIS — IMO0001 Reserved for inherently not codable concepts without codable children: Secondary | ICD-10-CM

## 2011-01-23 DIAGNOSIS — I059 Rheumatic mitral valve disease, unspecified: Secondary | ICD-10-CM

## 2011-01-23 LAB — GLUCOSE, CAPILLARY
Glucose-Capillary: 144 mg/dL — ABNORMAL HIGH (ref 70–99)
Glucose-Capillary: 201 mg/dL — ABNORMAL HIGH (ref 70–99)
Glucose-Capillary: 110 mg/dL — ABNORMAL HIGH (ref 70–99)
Glucose-Capillary: 152 mg/dL — ABNORMAL HIGH (ref 70–99)

## 2011-01-23 MED ORDER — WHITE PETROLATUM GEL
Status: AC
  Administered 2011-01-23: 07:00:00
  Filled 2011-01-23: qty 5

## 2011-01-23 NOTE — Progress Notes (Signed)
Patient ID: Oscar [name], male   DOB: [birth year], 71 y.o.   MRN: [id number] @ Subjective:  Needs pain meds  CT site hurts  Objective:  Filed Vitals:   01/22/11 2000 01/22/11 2147 01/23/11 0010 01/23/11 0330  BP: 107/59  100/71 94/55  Pulse: 104  76 72  Temp: 98.1 F (36.7 C)  98.1 F (36.7 C)   TempSrc: Oral  Oral   Resp: 28  26 18   Height:      Weight:      SpO2: 92% 94% 95% 94%    Intake/Output from previous day:  Intake/Output Summary (Last 24 hours) at 01/23/11 0802 Last data filed at 01/23/11 0600  Gross per 24 hour  Intake 1807.5 ml  Output   3405 ml  Net -1597.5 ml    Physical Exam: Physical exam: Well-developed frail in no acute distress.  Skin is warm and dry.  HEENT is significant for deviation of right eye Neck is supple. No thyromegaly.  Chest is diminished BS left lung field chest tube still in place Cardiovascular exam is irregular Abdominal exam nontender or distended. No masses palpated. Extremities show trace edema. neuro grossly intact    Lab Results: Basic Metabolic Panel:  Basename 01/22/11 0356 01/21/11 0412  NA 136 134*  K 3.1* 3.4*  CL 102 103  CO2 29 25  GLUCOSE 87 179*  BUN 7 6  CREATININE 0.98 0.79  CALCIUM 7.5* 7.7*  MG -- --  PHOS -- --   CBC:  Basename 01/22/11 0356 01/21/11 0412  WBC 11.7* 16.3*  NEUTROABS -- --  HGB 9.4* 10.6*  HCT 27.1* 30.2*  MCV 80.9 80.1  PLT 283 302   Cardiac Enzymes: 1) empyema - continue antibiotics; thoracic surgery plans VATS. 2) NSTEMI - continue ASA, metoprolol and statin; most likely demand ischemia; plan myoview after he recovers form lung infection. Add DVT prophylaxis heparin following surgery. 3) tachycardia - continue metoprolol; telemetry reviewed; sinus with pacs and pvcs. Rate is improved 4) DM - continue present meds and follow CBGs. 5) Decubitus ulcer - wound care consult 6) hypertension - continue present BP meds 7) tobacco abuse - DC  Assessment/Plan:    Oscar [name] 01/23/2011, 8:02 AM

## 2011-01-23 NOTE — Progress Notes (Addendum)
                    [address]            [place] [address]          [phone]     4 Days Post-Op Procedure(s) (LRB): VIDEO ASSISTED THORACOSCOPY (Left) VIDEO BRONCHOSCOPY (N/A)  Subjective: Comfortable, no complaints.    Objective: Vital signs in last 24 hours: Patient Vitals for the past 24 hrs:  BP Temp Temp src Pulse Resp SpO2  01/23/11 1204 - - - - - 96 %  01/23/11 1200 - 98.1 F (36.7 C) Axillary 56  20  93 %  01/23/11 0838 - - - - - 97 %  01/23/11 0815 106/76 mmHg 98.7 F (37.1 C) - 93  26  97 %  01/23/11 0330 94/55 mmHg - - 72  18  94 %  01/23/11 0010 100/71 mmHg 98.1 F (36.7 C) Oral 76  26  95 %  01/22/11 2147 - - - - - 94 %  01/22/11 2000 107/59 mmHg 98.1 F (36.7 C) Oral 104  28  92 %  01/22/11 1815 99/67 mmHg - - 103  30  92 %  01/22/11 1700 - - - - - 91 %  01/22/11 1538 102/50 mmHg 98.4 F (36.9 C) Oral 118  27  90 %  01/22/11 1300 112/68 mmHg 98.7 F (37.1 C) Oral 104  23  93 %  01/22/11 1225 - - - - - 93 %   Current Weight  01/21/11 97.297 kg (214 lb 8 oz)   CBGs- 254-140-  Intake/Output from previous day: 01/13 0701 - 01/14 0700 In: 2110 [P.O.:1440; I.V.:20; IV Piggyback:650] Out: 3415 [Urine:3375; Chest Tube:40]    PHYSICAL EXAM:  Heart: RRR, freq PVCs Lungs: diminished BS in left base Wound: clean and dry CT - no air leak noted, but cough weak  Lab Results: CBC: Basename 01/22/11 0356 01/21/11 0412  WBC 11.7* 16.3*  HGB 9.4* 10.6*  HCT 27.1* 30.2*  PLT 283 302   BMET:  Basename 01/22/11 0356 01/21/11 0412  NA 136 134*  K 3.1* 3.4*  CL 102 103  CO2 29 25  GLUCOSE 87 179*  BUN 7 6  CREATININE 0.98 0.79  CALCIUM 7.5* 7.7*    PT/INR: No results found for this basename: LABPROT,INR in the last 72 hours CXR- no obvious ptx  Assessment/Plan: S/P Procedure(s) (LRB): VIDEO ASSISTED THORACOSCOPY (Left) VIDEO BRONCHOSCOPY (N/A) Pulm- no air leak today. Possibly decrease CT to H2O seal. CV- per  cardiology.  Follow up K+ in am in light of frequent PVCs and hypokalemia on labs yesterday.  BPs generally stable on lower dose Lopressor. DM- now on po meds.  Monitor. ID- Continue Vanc/Zosyn Mobilize, pulm toilet.   LOS: 5 days    COLLINS,GINA H 01/23/2011   patient examined and medical record reviewed,agree with above note.  2D echo shows good LV function  Will need PICC for outpatient Va and anco   VAN TRIGT III,PETER 01/23/2011

## 2011-01-23 NOTE — Progress Notes (Signed)
  Echocardiogram 2D Echocardiogram has been performed.   Dola Lunsford Nira Retort 01/23/2011, 9:56 AM

## 2011-01-24 ENCOUNTER — Inpatient Hospital Stay (HOSPITAL_COMMUNITY): Payer: Medicare (Managed Care)

## 2011-01-24 DIAGNOSIS — IMO0001 Reserved for inherently not codable concepts without codable children: Secondary | ICD-10-CM

## 2011-01-24 DIAGNOSIS — E1165 Type 2 diabetes mellitus with hyperglycemia: Secondary | ICD-10-CM

## 2011-01-24 LAB — BASIC METABOLIC PANEL
BUN: 6 mg/dL (ref 6–23)
CO2: 32 mEq/L (ref 19–32)
Chloride: 96 mEq/L (ref 96–112)
Creatinine, Ser: 1.09 mg/dL (ref 0.50–1.35)
Glucose, Bld: 121 mg/dL — ABNORMAL HIGH (ref 70–99)

## 2011-01-24 LAB — GLUCOSE, CAPILLARY
Glucose-Capillary: 134 mg/dL — ABNORMAL HIGH (ref 70–99)
Glucose-Capillary: 139 mg/dL — ABNORMAL HIGH (ref 70–99)
Glucose-Capillary: 131 mg/dL — ABNORMAL HIGH (ref 70–99)
Glucose-Capillary: 114 mg/dL — ABNORMAL HIGH (ref 70–99)

## 2011-01-24 MED ORDER — POTASSIUM CHLORIDE CRYS ER 20 MEQ PO TBCR
30.0000 meq | EXTENDED_RELEASE_TABLET | Freq: Once | ORAL | Status: AC
  Administered 2011-01-24: 30 meq via ORAL
  Filled 2011-01-24: qty 1

## 2011-01-24 MED ORDER — MUPIROCIN 2 % EX OINT
1.0000 "application " | TOPICAL_OINTMENT | Freq: Two times a day (BID) | CUTANEOUS | Status: AC
  Administered 2011-01-24 – 2011-01-28 (×10): 1 via NASAL
  Filled 2011-01-24 (×2): qty 22

## 2011-01-24 MED ORDER — CHLORHEXIDINE GLUCONATE CLOTH 2 % EX PADS
6.0000 | MEDICATED_PAD | Freq: Every day | CUTANEOUS | Status: DC
  Administered 2011-01-24: 6 via TOPICAL

## 2011-01-24 MED ORDER — POTASSIUM CHLORIDE 10 MEQ/50ML IV SOLN
INTRAVENOUS | Status: AC
  Administered 2011-01-24: 10 meq
  Filled 2011-01-24: qty 150

## 2011-01-24 NOTE — Progress Notes (Signed)
Patient ID: Oscar [name], male   DOB: 12/09/1940, 71 y.o.   MRN: [id number] Patient ID: Oscar [name], male   DOB: Sep 01, 1940, 70 y.o.   MRN: [id number] @ Subjective:  Needs pain meds  CT site hurts  Objective:  Filed Vitals:   01/24/11 0400 01/24/11 0744 01/24/11 1018 01/24/11 1110  BP: 112/55  99/58   Pulse: 93  110   Temp: 99.4 F (37.4 C)     TempSrc: Oral     Resp: 21     Height:      Weight:      SpO2: 93% 93%  91%    Intake/Output from previous day:  Intake/Output Summary (Last 24 hours) at 01/24/11 1126 Last data filed at 01/24/11 1020  Gross per 24 hour  Intake    180 ml  Output   2680 ml  Net  -2500 ml    Physical Exam: Physical exam: Well-developed frail in no acute distress.  Skin is warm and dry.  HEENT is significant for deviation of right eye Neck is supple. No thyromegaly.  Chest is diminished BS left lung field chest tube still in place Cardiovascular exam is irregular Abdominal exam nontender or distended. No masses palpated. Extremities show trace edema. neuro grossly intact    Lab Results: Basic Metabolic Panel:  Basename 01/24/11 0430 01/22/11 0356  NA 136 136  K 3.4* 3.1*  CL 96 102  CO2 32 29  GLUCOSE 121* 87  BUN 6 7  CREATININE 1.09 0.98  CALCIUM 8.1* 7.5*  MG -- --  PHOS -- --   CBC:  Basename 01/22/11 0356  WBC 11.7*  NEUTROABS --  HGB 9.4*  HCT 27.1*  MCV 80.9  PLT 283   Cardiac Enzymes: 1) empyema - continue antibiotics; PIC line for outpt administration 2) NSTEMI - continue ASA, metoprolol and statin; most likely demand ischemia; plan myoview after he recovers form lung infection. Echo with normal EF   3) tachycardia - continue metoprolol; telemetry reviewed; sinus with pacs and pvcs.  Supple K 4) DM - continue present meds and follow CBGs. 5) Decubitus ulcer - wound care consult 6) hypertension - continue present BP meds 7) tobacco abuse - DC  Assessment/Plan:    Oscar [name] 01/24/2011, 11:26  AM

## 2011-01-24 NOTE — Progress Notes (Signed)
Physical Therapy Evaluation Patient Details Name: Oscar [name] MRN: [id number] DOB: Jan 16, 1940 Today's Date: 01/24/2011  Problem List:  Patient Active Problem List  Diagnoses  . Tachycardia  . Myocardial infarction  . Acute myocardial infarction, subendocardial infarction, initial episode of care    Past Medical History:  Past Medical History  Diagnosis Date  . Hypertension   . Diabetes mellitus   . Bronchitis   . GERD (gastroesophageal reflux disease)   . Hemorrhoid   . Arthritis     "fingers"  . COPD (chronic obstructive pulmonary disease)   . Acute renal insufficiency     During admission 01/2011   Past Surgical History:  Past Surgical History  Procedure Date  . Appendectomy 1990's  . Tonsillectomy     "when I was a kid"  . Video assisted thoracoscopy 01/19/2011    Procedure: VIDEO ASSISTED THORACOSCOPY;  Surgeon: Kathlee Nations Suann Larry, MD;  Location: [facility] OR;  Service: Thoracic;  Laterality: Left;  . Video bronchoscopy 01/19/2011    Procedure: VIDEO BRONCHOSCOPY;  Surgeon: Mikey Bussing, MD;  Location: [facility] OR;  Service: Thoracic;  Laterality: N/A;    PT Assessment/Plan/Recommendation PT Assessment Clinical Impression Statement: Patient presents S/P VATS for empyema. He has also experienced tachycardia and a NSTEMI. He will benefit from PT to ensure that he reaches sufficient level of independence with mobility to decrease burden of care on his family PT Recommendation/Assessment: Patient will need skilled PT in the acute care venue PT Problem List: Decreased activity tolerance;Decreased balance;Decreased mobility;Decreased knowledge of use of DME;Pain;Cardiopulmonary status limiting activity PT Therapy Diagnosis : Difficulty walking;Acute pain PT Plan PT Frequency: Min 3X/week PT Treatment/Interventions: DME instruction;Gait training;Stair training;Functional mobility training;Therapeutic activities;Therapeutic exercise;Balance training;Patient/family  education PT Recommendation Follow Up Recommendations: Home health PT;Supervision - Intermittent (potential for 24 hours for brief period) Equipment Recommended: Rolling walker with 5" wheels PT Goals  Acute Rehab PT Goals PT Goal Formulation: With patient Time For Goal Achievement: 2 weeks Pt will Roll Supine to Right Side: Independently PT Goal: Rolling Supine to Right Side - Progress: Not met Pt will Roll Supine to Left Side: Independently PT Goal: Rolling Supine to Left Side - Progress: Not met Pt will go Supine/Side to Sit: Independently PT Goal: Supine/Side to Sit - Progress: Not met Pt will go Sit to Supine/Side: Independently PT Goal: Sit to Supine/Side - Progress: Not met Pt will go Sit to Stand: Independently;with upper extremity assist PT Goal: Sit to Stand - Progress: Not met Pt will go Stand to Sit: Independently PT Goal: Stand to Sit - Progress: Not met Pt will Stand: Independently;1 - 2 min;with no upper extremity support PT Goal: Stand - Progress: Not met Pt will Ambulate: >150 feet;with modified independence;with least restrictive assistive device PT Goal: Ambulate - Progress: Not met Pt will Go Up / Down Stairs: 3-5 stairs;with rail(s);with supervision PT Goal: Up/Down Stairs - Progress: Not met  PT Evaluation Precautions/Restrictions  Precautions Precautions: Fall Precaution Comments: Chest tube to water seal Prior Functioning  Home Living Lives With: Spouse (and 51 y/o mother) Receives Help From:  (Wife retired and mother self sufficient and does cook) Type of Home: House Home Layout: One level Home Access: Stairs to enter Entrance Stairs-Rails:  (Patient states rail present but not that secure) Entrance Stairs-Number of Steps: 3 Home Adaptive Equipment: Walker - rolling;Straight cane Additional Comments: States equipment secondary to old right knee injury Prior Function Level of Independence: Independent with basic ADLs;Independent with homemaking with  ambulation Vocation:  Retired Financial risk analyst Arousal/Alertness: Awake/alert Overall Cognitive Status: Appears within functional limits for tasks assessed Orientation Level: Oriented X4 Sensation/Coordination Sensation Light Touch: Appears Intact Proprioception: Appears Intact Coordination Gross Motor Movements are Fluid and Coordinated: Yes Fine Motor Movements are Fluid and Coordinated: Yes Extremity Assessment RLE Assessment RLE Assessment: Within Functional Limits LLE Assessment LLE Assessment: Within Functional Limits Mobility (including Balance) Bed Mobility Bed Mobility: Yes Rolling Right: 4: Min assist Rolling Right Details (indicate cue type and reason): Initiated roll with pad secondary to pain if patient attempts to pull on rail with left upper extremity. Once initiated patient able to complete task independently Right Sidelying to Sit: 4: Min assist;HOB flat;With rails Right Sidelying to Sit Details (indicate cue type and reason): Min-guard assistance - good sequencing of legs and trunk with minimal rail use Sitting - Scoot to Edge of Bed: 6: Modified independent (Device/Increase time) Transfers Transfers: Yes Sit to Stand: 5: Supervision;From bed;With upper extremity assist Sit to Stand Details (indicate cue type and reason): Education regading hand placement to and from rolling walker Stand to Sit: 5: Supervision;With upper extremity assist;To chair/3-in-1 Ambulation/Gait Ambulation/Gait: Yes Ambulation/Gait Assistance: 4: Min assist Ambulation/Gait Assistance Details (indicate cue type and reason): Min-guard assistance - limited by pain and patient feels that he may have ambluated too far earlier in the day with nursing. Walker adjustd for height to improve upright posture. Ambulation Distance (Feet): 80 Feet Assistive device: Rolling walker Gait Pattern: Step-through pattern;Decreased stride length  Static Sitting Balance Static Sitting - Balance Support: No  upper extremity supported;Feet supported Static Sitting - Level of Assistance: 7: Independent Static Standing Balance Static Standing - Balance Support: Bilateral upper extremity supported Static Standing - Level of Assistance: 7: Independent End of Session PT - End of Session Activity Tolerance: Patient limited by pain;Patient limited by fatigue Patient left: in chair;with call bell in reach Nurse Communication: Mobility status for ambulation;Mobility status for transfers (pain) General Behavior During Session: [facility] for tasks performed Cognition: [facility] for tasks performed  Edwyna Perfect, PT  Pager [phone]  01/24/2011, 11:36 AM

## 2011-01-24 NOTE — Progress Notes (Signed)
patient examined and medical record reviewed,agree with above note.  He will need IV VANC for MRSA empyema for 2 wks. Will place PICC and ask for [facility] from case manager Will keep in [facility] until all tubes out VAN TRIGT III,[place] 01/24/2011

## 2011-01-24 NOTE — Progress Notes (Addendum)
5 Days Post-Op Procedure(s) (LRB): VIDEO ASSISTED THORACOSCOPY (Left) VIDEO BRONCHOSCOPY (N/A)  Subjective: Patient with some incisional pain and at chest tube sites.Continues with productive cough.  Objective: Vital signs in last 24 hours: Patient Vitals for the past 24 hrs:  BP Temp Temp src Pulse Resp SpO2  01/24/11 1216 - - - - - 94 %  01/24/11 1110 - - - - - 91 %  01/24/11 1018 99/58 mmHg - - 110  - -  01/24/11 0744 - - - - - 93 %  01/24/11 0400 112/55 mmHg 99.4 F (37.4 C) Oral 93  21  93 %  01/24/11 0000 107/80 mmHg 98.4 F (36.9 C) Oral 85  24  95 %  01/23/11 1955 114/56 mmHg - - - - -  01/23/11 1937 - - - - - 94 %  01/23/11 1550 103/62 mmHg 98.6 F (37 C) Oral 98  30  90 %    Current Weight  01/21/11 214 lb 8 oz (97.297 kg)      Intake/Output from previous day: 01/14 0701 - 01/15 0700 In: 170 [P.O.:120; IV Piggyback:50] Out: 3980 [Urine:3950; Chest Tube:30]   Physical Exam:  Cardiovascular: RRR, no murmurs, gallops, or rubs. Pulmonary: Clear to auscultation bilaterally; no rales, wheezes, or rhonchi. Abdomen: Soft, non tender, bowel sounds present. Extremities: Bilateral lower extremity edema. Wounds: Clean and dry.  No erythema or signs of infection.  Lab Results: CBC: Basename 01/22/11 0356  WBC 11.7*  HGB 9.4*  HCT 27.1*  PLT 283   BMET:  Basename 01/24/11 0430 01/22/11 0356  NA 136 136  K 3.4* 3.1*  CL 96 102  CO2 32 29  GLUCOSE 121* 87  BUN 6 7  CREATININE 1.09 0.98  CALCIUM 8.1* 7.5*    PT/INR: No results found for this basename: LABPROT,INR in the last 72 hours ABG:  INR: Will add last result for INR, ABG once components are confirmed Will add last 4 CBG results once components are confirmed  Assessment/Plan:  1. CV - s/p NSTEMI.Continue current medications. Per cardiology. 2.  Pulmonary - Encourage incentive spirometer.CXR this am shows LLL atelectasis and no pneumothorax.No air leak from chest tubes.Will remove posterior chest  tube.MRSA emypema-continue Vancomycin and Zosyn. 3.DM-CBGs 152/134/139.Continue Glipizide and Metformin. 4.  Acute blood loss anemia - Last H/H 9.4/27.1. 5.Supplement Potassium (given 3 runs). Will also give po Potassium. 6.PICC line. 7.Wound care consult for decubitus ulcer (patient was transferred from [facility] with this) 8.Consult to care manager for home health needs.   ZIMMERMAN,DONIELLE MPA-C 01/24/2011

## 2011-01-25 ENCOUNTER — Inpatient Hospital Stay (HOSPITAL_COMMUNITY): Payer: Medicare (Managed Care)

## 2011-01-25 DIAGNOSIS — I219 Acute myocardial infarction, unspecified: Secondary | ICD-10-CM

## 2011-01-25 LAB — GLUCOSE, CAPILLARY
Glucose-Capillary: 129 mg/dL — ABNORMAL HIGH (ref 70–99)
Glucose-Capillary: 211 mg/dL — ABNORMAL HIGH (ref 70–99)
Glucose-Capillary: 123 mg/dL — ABNORMAL HIGH (ref 70–99)
Glucose-Capillary: 251 mg/dL — ABNORMAL HIGH (ref 70–99)

## 2011-01-25 MED ORDER — FUROSEMIDE 10 MG/ML IJ SOLN
40.0000 mg | Freq: Two times a day (BID) | INTRAMUSCULAR | Status: DC
  Administered 2011-01-25 – 2011-01-28 (×7): 40 mg via INTRAVENOUS
  Filled 2011-01-25 (×8): qty 4

## 2011-01-25 MED ORDER — LACTULOSE 10 GM/15ML PO SOLN
20.0000 g | Freq: Once | ORAL | Status: DC
  Filled 2011-01-25: qty 30

## 2011-01-25 MED ORDER — LACTULOSE 10 GM/15ML PO SOLN
30.0000 g | Freq: Every day | ORAL | Status: AC
  Filled 2011-01-25: qty 45

## 2011-01-25 MED ORDER — CHLORHEXIDINE GLUCONATE CLOTH 2 % EX PADS
6.0000 | MEDICATED_PAD | Freq: Every day | CUTANEOUS | Status: AC
  Administered 2011-01-25 – 2011-01-28 (×5): 6 via TOPICAL

## 2011-01-25 MED ORDER — FUROSEMIDE 10 MG/ML IJ SOLN
40.0000 mg | Freq: Once | INTRAMUSCULAR | Status: AC
  Administered 2011-01-25: 40 mg via INTRAVENOUS
  Filled 2011-01-25: qty 4

## 2011-01-25 MED ORDER — MAGIC MOUTHWASH
5.0000 mL | Freq: Four times a day (QID) | ORAL | Status: DC
  Administered 2011-01-25 – 2011-01-30 (×21): 5 mL via ORAL
  Filled 2011-01-25 (×23): qty 5

## 2011-01-25 MED ORDER — GLIPIZIDE 5 MG PO TABS
5.0000 mg | ORAL_TABLET | Freq: Two times a day (BID) | ORAL | Status: DC
  Administered 2011-01-25 – 2011-01-30 (×12): 5 mg via ORAL
  Filled 2011-01-25 (×12): qty 1

## 2011-01-25 MED ORDER — POTASSIUM CHLORIDE CRYS ER 20 MEQ PO TBCR
20.0000 meq | EXTENDED_RELEASE_TABLET | Freq: Every day | ORAL | Status: DC
  Administered 2011-01-25: 20 meq via ORAL
  Filled 2011-01-25 (×2): qty 1

## 2011-01-25 MED ORDER — MAGIC MOUTHWASH
5.0000 mL | Freq: Three times a day (TID) | ORAL | Status: DC | PRN
  Filled 2011-01-25 (×2): qty 5

## 2011-01-25 NOTE — Progress Notes (Addendum)
6 Days Post-Op Procedure(s) (LRB): VIDEO ASSISTED THORACOSCOPY (Left) VIDEO BRONCHOSCOPY (N/A)  Subjective: Patient without bowel movement but is passing flatus. Objective: Vital signs in last 24 hours: Patient Vitals for the past 24 hrs:  BP Temp Temp src Pulse Resp SpO2  01/25/11 0751 - - - - - 88 %  01/25/11 0329 105/55 mmHg 98.4 F (36.9 C) Oral 99  23  93 %  01/25/11 0010 90/55 mmHg 98.6 F (37 C) Oral 89  24  94 %  01/24/11 2159 - - - 69  28  92 %  01/24/11 2000 107/66 mmHg 98 F (36.7 C) Oral - - -  01/24/11 1600 - 98.2 F (36.8 C) Oral - - -  01/24/11 1547 - - - - - 95 %  01/24/11 1216 - - - - - 94 %  01/24/11 1200 99/51 mmHg 98.2 F (36.8 C) Oral - - -  01/24/11 1110 - - - - - 91 %  01/24/11 1018 99/58 mmHg - - 110  - -    Current Weight  01/21/11 214 lb 8 oz (97.297 kg)      Intake/Output from previous day: 01/15 0701 - 01/16 0700 In: 1125 [P.O.:840; I.V.:10; IV Piggyback:275] Out: 1090 [Urine:1075; Chest Tube:15]   Physical Exam:  Cardiovascular: RRR, no murmurs, gallops, or rubs. Pulmonary: Diminished at bases bilaterally (L>R); no rales, wheezes, or rhonchi. Abdomen: Soft, non tender, bowel sounds present. Extremities: Bilateral lower extremity edema. Wounds: Clean and dry.  No erythema or signs of infection.  Lab Results: CBC:No results found for this basename: WBC:2,HGB:2,HCT:2,PLT:2 in the last 72 hours BMET:   [facility] 01/24/11 0430  NA 136  K 3.4*  CL 96  CO2 32  GLUCOSE 121*  BUN 6  CREATININE 1.09  CALCIUM 8.1*    PT/INR: No results found for this basename: LABPROT,INR in the last 72 hours ABG:  INR: Will add last result for INR, ABG once components are confirmed Will add last 4 CBG results once components are confirmed  Assessment/Plan:  1. CV - s/p NSTEMI.Continue current medications. Per cardiology. 2.  Pulmonary - Encourage incentive spirometer and flutter valve..CXR this am shows patient rotated to right, questionable  worsening bibasilar opacities (atelectasis vs pulm edema) and no pneumothorax.No air leak from chest tube. Obtain PA/LAT CXR in am. Leave chest tube for today.MRSA emypema-continue Vancomycin and Zosyn. 3.DM-CBGs 139/131/114.Continue Glipizide and Metformin. 4.  Acute blood loss anemia - Last H/H 9.4/27.1. 5.PICC line. 7.Continue wound care for decubitus ulcer (patient was transferred from [facility] with this) 8.Magic mouthwash for thrush. 9.Continue Lasix IV. 10.LOC constipation.  ZIMMERMAN,DONIELLE MPA-C 01/25/2011   patient examined and medical record reviewed,agree with above note.Will remove last tube and arrange for home IV Vanco 10 days for MRSA empyema 01/25/2011

## 2011-01-25 NOTE — Progress Notes (Signed)
Have spoken to IV team today times 2 inregards to [facility] placement. First nurse this AM stated pt. Would get PIC in AM.Now it is 4pm and have called IV team and pt. Will get it placed 1st thing in AM, unless there is a nurse at 7pm to place IV.

## 2011-01-25 NOTE — Progress Notes (Signed)
I have seen and examined the patient. I agree with the above note with the addition of : He is slightly fluid overloaded. Complains of a sore in his mouth likely thrush.  Will increase Lasix to bid. Monitor renal function. Ischemic cardiac evaluation with a stress test might best be done as an outpatient once he fully recovers from current illness.   Lorine Bears 01/25/2011 1:35 PM

## 2011-01-25 NOTE — Progress Notes (Signed)
Patient Name: Oscar [name] Date of Encounter: 01/25/2011     Active Problems:  Tachycardia  Myocardial infarction  Acute myocardial infarction, subendocardial infarction, initial episode of care    SUBJECTIVE: In good spirits. Denies chest pain, sob, diaphoresis, palpitations, lightheadedness. States CT site is sore. Having loose stools.   OBJECTIVE  Filed Vitals:   01/24/11 2159 01/25/11 0010 01/25/11 0329 01/25/11 0751  BP:  90/55 105/55   Pulse: 69 89 99   Temp:  98.6 F (37 C) 98.4 F (36.9 C)   TempSrc:  Oral Oral   Resp: 28 24 23    Height:      Weight:      SpO2: 92% 94% 93% 88%    Intake/Output Summary (Last 24 hours) at 01/25/11 0856 Last data filed at 01/25/11 0800  Gross per 24 hour  Intake   1125 ml  Output   1280 ml  Net   -155 ml   Weight change:   PHYSICAL EXAM  General: Well developed, well nourished, NAD Head: Normocephalic, atraumatic, sclera non-icteric, no xanthomas, Neck: Supple without bruits or JVD. Lungs:  Resp regular and unlabored, bibasilar rales noted Heart: RRR no s3, s4, or murmurs. Abdomen: Soft, non-tender, non-distended, BS + x 4.  Msk:  Strength and tone appears normal for age. Extremities: 1+ pitting edema to mid-calf. No clubbing or cyanosis. DP/PT/Radials 2+ and equal bilaterally. Neuro: Alert and oriented X 3. Moves all extremities spontaneously. Psych: Normal affect.  LABS:   Lab 01/24/11 0430 01/22/11 0356 01/21/11 0412  NA 136 136 134*  K 3.4* 3.1* 3.4*  CL 96 102 103  CO2 32 29 25  BUN 6 7 6   CREATININE 1.09 0.98 0.79  CALCIUM 8.1* 7.5* 7.7*  PROT -- -- 5.5*  BILITOT -- -- 0.5  ALKPHOS -- -- 51  ALT -- -- 6  AST -- -- 13  AMYLASE -- -- --  LIPASE -- -- --  GLUCOSE 121* 87 179*    TELE: sinus tachycardia, 90-100s one episode of bigeminy this morning, PVCs, PACs   ECG: no new tracings   Radiology/Studies:  Dg Chest 2 View  01/24/2011  *RADIOLOGY REPORT*  Clinical Data: Status post VATS   CHEST - 2 VIEW  Comparison: Chest x-ray 01/23/2011.  Findings: The right IJ catheter and left chest tubes are stable. No pneumothorax.  Improved right basilar aeration.  Persistent left lower lobe atelectasis and a pseudotumor of fluid in the major fissure.  IMPRESSION:  1.  Stable support apparatus. 2.  Improving lung aeration.  Original Report Authenticated By: P. Loralie Champagne, M.D.   X-ray Chest Pa And Lateral   01/19/2011  *RADIOLOGY REPORT*  Clinical Data: History of pneumonia, post right-sided thoracentesis.  CHEST - 2 VIEW  Comparison: 01/18/2011; 01/16/2011; chest CT - 01/17/2011  Findings: Grossly unchanged cardiac silhouette and mediastinal contours was tortuous and obscuration of left heart border secondary to grossly unchanged heterogeneous air space opacities within left mid and lower lung  and fluid within the fissure as was demonstrated on recent chest CT.  There is persistent mild elevation of the left hemidiaphragm.  Persistent blunting right costophrenic angle.  Right hemithorax is grossly unchanged.  No pneumothorax.  Unchanged bones.  IMPRESSION: 1.  Grossly unchanged left mid and lower lung heterogeneous air space opacities worrisome for infection. 2.  Grossly unchanged small bilateral effusions, left greater than right.  Original Report Authenticated By: Waynard Reeds, M.D.   Dg Chest 2 View  01/18/2011  *  RADIOLOGY REPORT*  Clinical Data: Left-sided effusion.  CHEST - 2 VIEW  Comparison: 01/16/2011  Findings: Patchy airspace disease is seen at the left lung base, consistent with pneumonia.  Right lung is clear.  Lung volumes are low.  Cardiopericardial silhouette is at upper limits of normal for size.  Probable associated left pleural effusion posteriorly.  IMPRESSION: Left base collapse / consolidation with effusion.  This may be related to pneumonia, follow-up is recommended to ensure there is no underlying mass lesion.  Original Report Authenticated By: ERIC A. MANSELL, M.D.   Dg  Chest Port 1 View  01/25/2011  *RADIOLOGY REPORT*  Clinical Data: Empyema.  Postop from left VATS.  PORTABLE CHEST - 1 VIEW  Comparison: 01/24/2011  Findings: A single left chest tube remains in place as well as a right internal jugular center venous catheter, both of which are in appropriate position.  No pneumothorax identified.  Worsening symmetric bibasilar pulmonary opacity is seen, which may be due to worsening atelectasis or pulmonary edema. Small left pleural effusion shows no significant change in size.  Mass like opacity again noted in the left midlung, which corresponds with a loculated pleural fluid collection within the pulmonary fissure shown on recent chest CT.  IMPRESSION: Worsening symmetric, bibasilar predominant, pulmonary opacity which may be due to atelectasis or edema. Stable small left pleural effusion with loculated component seen within the pulmonary fissure.  Original Report Authenticated By: Danae Orleans, M.D.   Dg Chest Port 1 View  01/23/2011  *RADIOLOGY REPORT*  Clinical Data: VATS and drainage of empyema.  PORTABLE CHEST - 1 VIEW  Comparison: 01/22/2011  Findings: Stable position of the left chest tube.  Persistent pleural and parenchymal densities in the left lung.  There is improved aeration at the right lung base.  Heart size is grossly stable.  There is no evidence for a large pneumothorax.  Round density in the left hilum may be related to focal pleural fluid or airspace disease. Stable position of the right jugular central venous catheter.  IMPRESSION: Stable position of the left chest tubes without a large pneumothorax.  Persistent pleural and parenchymal disease in the left lung.  Improving aeration in the right lung.  Original Report Authenticated By: Richarda Overlie, M.D.   Dg Chest Port 1 View  01/22/2011  *RADIOLOGY REPORT*  Clinical Data: Video assisted thoracoscopic surgery  PORTABLE CHEST - 1 VIEW  Comparison: 01/21/2011  Findings: Bilateral airspace opacities are  worse.  While the left chest tube has been removed with the other to left in place. Stable airspace disease.  Stable central venous catheter.  No pneumothorax.  IMPRESSION: One chest tube removed without pneumothorax. Stable airspace disease.  Original Report Authenticated By: Donavan Burnet, M.D.   Dg Chest Port 1 View  01/21/2011  *RADIOLOGY REPORT*  Clinical Data: Postop video assisted thoracoscopic surgery  PORTABLE CHEST - 1 VIEW  Comparison: Yesterday  Findings: Endotracheal and NG tubes removed.  Tubular devices otherwise stable.  Bilateral opacities compatible with a combination of airspace disease and pleural fluid unchanged.  No pneumothorax.  IMPRESSION: Extubated.  Bilateral pulmonary opacity unchanged.  No pneumothorax.  Original Report Authenticated By: Donavan Burnet, M.D.   Dg Chest Port 1 View  01/20/2011  *RADIOLOGY REPORT*  Clinical Data: Chest tubes in place.  PORTABLE CHEST - 1 VIEW  Comparison: 01/19/2011.  Findings: Endotracheal tube tip 5.2 cm above the carina.  Right central line tip proximal superior vena cava level.  Three left-sided chest tubes  remain in place.  There may be a tiny left apical pneumothorax.  Consolidation mid to lower lung bilaterally unchanged.  Pulmonary vascular prominence.  Mediastinal and cardiac silhouette stable.  IMPRESSION: Three left-sided chest tubes are in place.  There may be a tiny left apical pneumothorax.  Remainder of findings without significant change.  Original Report Authenticated By: Fuller Canada, M.D.   Dg Chest Portable 1 View  01/19/2011  *RADIOLOGY REPORT*  Clinical Data: Missing needle and a wire.  Endotracheal tube placement and central line placement.  PORTABLE CHEST - 1 VIEW  Comparison: 01/19/2011  Findings: No surgical needle or other retained instrument visualized within the field of view.  Endotracheal tube tip is approximately 6 cm above the carina. Nasogastric is seen entering the stomach.  Right jugular center venous  catheter tip is in the distal SVC. Skin staples are seen in the left chest wall.  Three left-sided chest tubes are now seen in place.  No pneumothorax visualized.  Bilateral lower lung opacities seen which is increased on the right and likely due to layering pleural effusions and atelectasis.  Heart size is stable.  IMPRESSION:  1.  No surgical needle or other retained instrument identified within field of view. 2.  Support apparatus in appropriate position.  No pneumothorax visualized. 3. Bibasilar opacity, which is increased on the right, likely representing pleural effusions and atelectasis.  Original Report Authenticated By: Danae Orleans, M.D.   Current Medications:     . albuterol  2.5 mg Nebulization QID  . aspirin EC  81 mg Oral Daily  . bisacodyl  10 mg Oral Daily  . Chlorhexidine Gluconate Cloth  6 each Topical Q0600  . docusate sodium  100 mg Oral Daily  . enoxaparin  40 mg Subcutaneous Q24H  . feeding supplement  1 Container Oral TID WC  . folic acid  1 mg Oral Daily  . furosemide  40 mg Intravenous Daily  . glipiZIDE  5 mg Oral BID AC  . guaiFENesin  1,200 mg Oral BID  . insulin aspart  0-24 Units Subcutaneous TID WC  . lactulose  20 g Oral Once  . metFORMIN  500 mg Oral BID WC  . metoprolol tartrate  25 mg Oral BID  . mupirocin ointment  1 application Nasal BID  . nicotine  21 mg Transdermal Daily  . pantoprazole  40 mg Oral Q1200  . piperacillin-tazobactam (ZOSYN)  IV  3.375 g Intravenous Q8H  . potassium chloride  30 mEq Oral Once  . potassium chloride  20 mEq Oral Daily  . rosuvastatin  20 mg Oral QHS  . sodium chloride  10 mL Intravenous Q12H  . thiamine  100 mg Oral Daily  . vancomycin  1,000 mg Intravenous Q12H  . DISCONTD: Chlorhexidine Gluconate Cloth  6 each Topical Q0600  . DISCONTD: glipiZIDE  5 mg Oral BID AC    ASSESSMENT AND PLAN:   1. Empyema- on Vanc/Zosyn; PICC line for Vanc  2. Fluid overload- bibasilar rales, LE edema on exam, CXR revealed  worsening bibasilar edema; echo performed on 01/14 was incomplete and had to be stopped due to pain, no clear assessment of systolic/diastolic function. Will increase to Lasix 40mg  IV BID and monitor.   3. NSTEMI- believed to be demand ischemia secondary to acute illness, on Metoprolol, ASA, statin. Stress test after improves.  4. Tachycardia- sinus tachycardia, continue Metoprolol, rate is appropriate in the setting of acute illness  5. HTN- BP well-controlled, continue current meds  6. Decubitus ulcer- wound care has seen, following recs   Signed, R. Hurman Horn, PA-C 01/25/2011, 8:56 AM

## 2011-01-25 NOTE — Progress Notes (Signed)
Pt smokes 1 ppd and is in action stage and willing to quit. Would like to use patches to help her quit. Recommended 21 mg patch x 6 weeks, 14 mg patch x 2 weeks and 7 mg patch x 2 weeks. Discussed patch use instructions. Referred to 1-800 quit now for f/u and support. Discussed oral fixation substitutes, second hand smoke and in home smoking policy. Reviewed and gave pt Written education/contact information.

## 2011-01-25 NOTE — Consult Note (Signed)
WOC consult Note Reason for Consult: req to eval pt for wound on sacrum.  Documentation that pt presented for this admission with this area present. Noted at the time of my assessment today pt also has candida present near rectum and in the perineal area, he does not have any itching but reports he is up to bathroom frequently.    Wound type:Stage II pressure ulcer of the sacrum/ upper gluteal folds  Pressure Ulcer POA: Yes  Measurement: 1.0cm x 0.5cm x 0.2cm  Wound bed: pink, clean  Drainage (amount, consistency, odor) minimal serosanguinous  Periwound: intact with some areas of blanchable tissue  Dressing procedure/placement/frequency: silicone foam dressing, to be changed every 3 days and PRN soilage  Education provided to pt on need for turning at least every 2 hours and when he is up in the chair to utilize pressure redistribution pad, will order today.  Pt on appropriate support surface at current for bed. Will order antifungal powder for candida.  If this should worsen would suggest PO antifungal.  Re consult if needed, will not follow at this time. Thanks  Allayah Raineri [employer], CWOCN [phone])

## 2011-01-26 ENCOUNTER — Inpatient Hospital Stay (HOSPITAL_COMMUNITY): Payer: Medicare (Managed Care)

## 2011-01-26 DIAGNOSIS — I214 Non-ST elevation (NSTEMI) myocardial infarction: Secondary | ICD-10-CM

## 2011-01-26 LAB — BASIC METABOLIC PANEL
CO2: 33 mEq/L — ABNORMAL HIGH (ref 19–32)
Chloride: 95 mEq/L — ABNORMAL LOW (ref 96–112)
GFR calc Af Amer: 78 mL/min — ABNORMAL LOW (ref >90)
Potassium: 3.2 mEq/L — ABNORMAL LOW (ref 3.5–5.1)

## 2011-01-26 LAB — GLUCOSE, CAPILLARY
Glucose-Capillary: 208 mg/dL — ABNORMAL HIGH (ref 70–99)
Glucose-Capillary: 378 mg/dL — ABNORMAL HIGH (ref 70–99)
Glucose-Capillary: 152 mg/dL — ABNORMAL HIGH (ref 70–99)
Glucose-Capillary: 253 mg/dL — ABNORMAL HIGH (ref 70–99)

## 2011-01-26 MED ORDER — SODIUM CHLORIDE 0.9 % IJ SOLN
10.0000 mL | Freq: Two times a day (BID) | INTRAMUSCULAR | Status: DC
  Administered 2011-01-26 – 2011-01-27 (×2): 10 mL
  Administered 2011-01-29: 20 mL
  Administered 2011-01-29: 10 mL

## 2011-01-26 MED ORDER — PRO-STAT SUGAR FREE PO LIQD
30.0000 mL | Freq: Two times a day (BID) | ORAL | Status: DC
  Administered 2011-01-27 – 2011-01-29 (×6): 30 mL via ORAL
  Filled 2011-01-26 (×10): qty 30

## 2011-01-26 MED ORDER — SODIUM CHLORIDE 0.9 % IJ SOLN
10.0000 mL | INTRAMUSCULAR | Status: DC | PRN
  Administered 2011-01-27: 20 mL
  Administered 2011-01-27: 10 mL
  Filled 2011-01-26 (×3): qty 20
  Filled 2011-01-26: qty 10
  Filled 2011-01-26: qty 20
  Filled 2011-01-26 (×2): qty 10

## 2011-01-26 MED ORDER — ALBUTEROL SULFATE (5 MG/ML) 0.5% IN NEBU
2.5000 mg | INHALATION_SOLUTION | Freq: Three times a day (TID) | RESPIRATORY_TRACT | Status: DC
  Administered 2011-01-26 – 2011-01-30 (×12): 2.5 mg via RESPIRATORY_TRACT
  Filled 2011-01-26 (×13): qty 0.5

## 2011-01-26 MED ORDER — POTASSIUM CHLORIDE CRYS ER 20 MEQ PO TBCR
20.0000 meq | EXTENDED_RELEASE_TABLET | Freq: Two times a day (BID) | ORAL | Status: DC
  Administered 2011-01-26 – 2011-01-30 (×9): 20 meq via ORAL
  Filled 2011-01-26 (×9): qty 1

## 2011-01-26 NOTE — Progress Notes (Signed)
Inpatient Diabetes Program Recommendations  AACE/ADA: New Consensus Statement on Inpatient Glycemic Control (2009)  Target Ranges:  Prepandial:   less than 140 mg/dL      Peak postprandial:   less than 180 mg/dL (1-2 hours)      Critically ill patients:  140 - 180 mg/dL   Reason for Visit:Results for MYKLE, PASCUA (MRN [id number]) as of 01/26/2011 11:05  Ref. Range 01/25/2011 07:58 01/25/2011 12:17 01/25/2011 15:59 01/25/2011 21:43 01/26/2011 08:13  Glucose-Capillary Latest Range: 70-99 mg/dL 409 (H) 811 (H) 914 (H) 251 (H) 208 (H)     Inpatient Diabetes Program Recommendations Insulin - Basal: Consider adding Lantus 10 units daily. HgbA1C: Note A1C 13.1% indicating poor glycemic control prior to admit. Will need follow-up as outpatient  Note: Will follow.

## 2011-01-26 NOTE — Progress Notes (Signed)
Physical Therapy Treatment Patient Details Name: Oscar [name] MRN: [id number] DOB: May 10, 1940 Today's Date: 01/26/2011  PT Assessment/Plan  PT - Assessment/Plan Comments on Treatment Session: Pt continues to make progress and able to increase ambulation distance.  Limited treatment session due to RN present for PICC placement.  Pt however continues to have mild LOB during ambulation with narrow base support.  Will continue to follow and assess. PT Plan: Discharge plan remains appropriate;Frequency remains appropriate PT Frequency: Min 3X/week Follow Up Recommendations: Home health PT;Supervision - Intermittent Equipment Recommended: Rolling walker with 5" wheels PT Goals  Acute Rehab PT Goals PT Goal Formulation: With patient Time For Goal Achievement: 2 weeks Pt will Roll Supine to Left Side: Independently PT Goal: Rolling Supine to Left Side - Progress: Progressing toward goal Pt will go Sit to Stand: Independently;with upper extremity assist PT Goal: Sit to Stand - Progress: Progressing toward goal Pt will go Stand to Sit: Independently PT Goal: Stand to Sit - Progress: Progressing toward goal Pt will Ambulate: >150 feet;with modified independence;with least restrictive assistive device PT Goal: Ambulate - Progress: Progressing toward goal  PT Treatment Precautions/Restrictions  Precautions Precautions: Fall Precaution Comments: Chest tube to water seal Restrictions Weight Bearing Restrictions: No Mobility (including Balance) Bed Mobility Bed Mobility: Yes Supine to Sit: 4: Min assist Supine to Sit Details (indicate cue type and reason): (A) with LE into bed Transfers Transfers: Yes Sit to Stand: 5: Supervision;From bed;With armrests Sit to Stand Details (indicate cue type and reason): Supervision for safety with cues for hand placement Stand to Sit: 5: Supervision;With upper extremity assist;To chair/3-in-1 Stand to Sit Details: supervision for safety with cues  for hand placement Ambulation/Gait Ambulation/Gait: Yes Ambulation/Gait Assistance: 4: Min assist Ambulation/Gait Assistance Details (indicate cue type and reason): Minguard for safety with occasional min (A) due to LOB especially with turns.  Pt tends to lean the left during LOB. Ambulation Distance (Feet): 150 Feet Assistive device: Rolling walker Gait Pattern: Step-through pattern;Decreased stride length  Static Sitting Balance Static Sitting - Balance Support: No upper extremity supported;Feet supported Static Sitting - Level of Assistance: 7: Independent Exercise    End of Session PT - End of Session Equipment Utilized During Treatment: Gait belt Activity Tolerance: Patient limited by pain;Patient limited by fatigue Patient left: in bed;with call bell in reach Nurse Communication: Mobility status for ambulation;Mobility status for transfers General Behavior During Session: [facility] for tasks performed Cognition: [facility] for tasks performed  Endrit Gittins 01/26/2011, 11:39 AM [phone]

## 2011-01-26 NOTE — Progress Notes (Signed)
Patient Name: Oscar [name] Date of Encounter: 01/26/2011      SUBJECTIVE: States CT site is sore. Otherwise no SOB or CP  OBJECTIVE  Filed Vitals:   01/25/11 2200 01/25/11 2215 01/26/11 0000 01/26/11 0435  BP:  102/62 154/99 93/53  Pulse: 51 78 88 56  Temp:   98.9 F (37.2 C) 99.1 F (37.3 C)  TempSrc:   Oral Oral  Resp: 26 22 21 22   Height:      Weight:      SpO2: 94% 91% 91% 92%    Intake/Output Summary (Last 24 hours) at 01/26/11 0725 Last data filed at 01/26/11 0400  Gross per 24 hour  Intake   2090 ml  Output   4600 ml  Net  -2510 ml   Weight change:   PHYSICAL EXAM  General: Well developed, well nourished, NAD Head: Normal Neck: Supple without bruits or JVD. Lungs: Diminished BS left lung field Heart: RRR no s3, s4, or murmurs. Abdomen: Soft, non-tender, non-distended, BS + x 4.  Msk:  Strength and tone appears normal for age. Extremities: 1+ pitting edema to mid-calf.  Neuro: Alert and oriented X 3. Moves all extremities spontaneously.  LABS:   Lab 01/26/11 0430 01/24/11 0430 01/22/11 0356 01/21/11 0412  NA 134* 136 136 --  K 3.2* 3.4* 3.1* --  CL 95* 96 102 --  CO2 33* 32 29 --  BUN 8 6 7  --  CREATININE 1.10 1.09 0.98 --  CALCIUM 8.0* 8.1* 7.5* --  PROT -- -- -- 5.5*  BILITOT -- -- -- 0.5  ALKPHOS -- -- -- 51  ALT -- -- -- 6  AST -- -- -- 13  AMYLASE -- -- -- --  LIPASE -- -- -- --  GLUCOSE 183* 121* 87 --    Current Medications:     . albuterol  2.5 mg Nebulization QID  . aspirin EC  81 mg Oral Daily  . bisacodyl  10 mg Oral Daily  . Chlorhexidine Gluconate Cloth  6 each Topical Q0600  . docusate sodium  100 mg Oral Daily  . enoxaparin  40 mg Subcutaneous Q24H  . feeding supplement  1 Container Oral TID WC  . folic acid  1 mg Oral Daily  . furosemide  40 mg Intravenous BID  . furosemide  40 mg Intravenous Once  . glipiZIDE  5 mg Oral BID AC  . guaiFENesin  1,200 mg Oral BID  . insulin aspart  0-24 Units Subcutaneous  TID WC  . lactulose  20 g Oral Once  . lactulose  30 g Oral Daily  . magic mouthwash  5 mL Oral QID  . metFORMIN  500 mg Oral BID WC  . metoprolol tartrate  25 mg Oral BID  . mupirocin ointment  1 application Nasal BID  . nicotine  21 mg Transdermal Daily  . pantoprazole  40 mg Oral Q1200  . piperacillin-tazobactam (ZOSYN)  IV  3.375 g Intravenous Q8H  . potassium chloride  20 mEq Oral Daily  . rosuvastatin  20 mg Oral QHS  . sodium chloride  10 mL Intravenous Q12H  . thiamine  100 mg Oral Daily  . vancomycin  1,000 mg Intravenous Q12H  . DISCONTD: furosemide  40 mg Intravenous Daily    ASSESSMENT AND PLAN:   1. Empyema- on Vanc/Zosyn; PICC line for Vanc; management per CVTS  2. Fluid overload- Still with volume excess; continue lasix and follow renal function. Supplement K.  3. NSTEMI- believed  to be demand ischemia secondary to acute illness, on Metoprolol, ASA, statin. Stress test after improves (as outpatient).  4. Tachycardia- sinus tachycardia, continue Metoprolol.  5. HTN- BP well-controlled, continue current meds  6. Decubitus ulcer- wound care has seen, following recs   Signed, R. Hurman Horn, PA-C 01/26/2011, 7:25 AM

## 2011-01-26 NOTE — Progress Notes (Addendum)
Nutrition Follow-up  Diet Order:  Oscar [name]  Meds: Scheduled Meds:   . albuterol  2.5 mg Nebulization TID  . aspirin EC  81 mg Oral Daily  . bisacodyl  10 mg Oral Daily  . Chlorhexidine Gluconate Cloth  6 each Topical Q0600  . docusate sodium  100 mg Oral Daily  . enoxaparin  40 mg Subcutaneous Q24H  . feeding supplement  1 Container Oral TID WC  . folic acid  1 mg Oral Daily  . furosemide  40 mg Intravenous BID  . furosemide  40 mg Intravenous Once  . glipiZIDE  5 mg Oral BID AC  . guaiFENesin  1,200 mg Oral BID  . insulin aspart  0-24 Units Subcutaneous TID WC  . lactulose  20 g Oral Once  . lactulose  30 g Oral Daily  . magic mouthwash  5 mL Oral QID  . metFORMIN  500 mg Oral BID WC  . metoprolol tartrate  25 mg Oral BID  . mupirocin ointment  1 application Nasal BID  . nicotine  21 mg Transdermal Daily  . pantoprazole  40 mg Oral Q1200  . piperacillin-tazobactam (ZOSYN)  IV  3.375 g Intravenous Q8H  . potassium chloride  20 mEq Oral BID  . rosuvastatin  20 mg Oral QHS  . sodium chloride  10 mL Intravenous Q12H  . sodium chloride  10-40 mL Intracatheter Q12H  . thiamine  100 mg Oral Daily  . vancomycin  1,000 mg Intravenous Q12H  . DISCONTD: albuterol  2.5 mg Nebulization QID  . DISCONTD: potassium chloride  20 mEq Oral Daily   Continuous Infusions:  PRN Meds:.ondansetron (ZOFRAN) IV, oxyCODONE, phenol, senna-docusate, sodium chloride, traMADol, DISCONTD: magic mouthwash  Labs:  CMP     Component Value Date/Time   NA 134* 01/26/2011 0430   K 3.2* 01/26/2011 0430   CL 95* 01/26/2011 0430   CO2 33* 01/26/2011 0430   GLUCOSE 183* 01/26/2011 0430   BUN 8 01/26/2011 0430   CREATININE 1.10 01/26/2011 0430   CALCIUM 8.0* 01/26/2011 0430   PROT 5.5* 01/21/2011 0412   ALBUMIN 1.8* 01/21/2011 0412   AST 13 01/21/2011 0412   ALT 6 01/21/2011 0412   ALKPHOS 51 01/21/2011 0412   BILITOT 0.5 01/21/2011 0412   GFRNONAA 67* 01/26/2011 0430   GFRAA 78* 01/26/2011 0430   CBG (last 3)    Basename 01/26/11 0813 01/25/11 2143 01/25/11 1559  GLUCAP 208* 251* 123*     Intake/Output Summary (Last 24 hours) at 01/26/11 1036 Last data filed at 01/26/11 0400  Gross per 24 hour  Intake   1550 ml  Output   4400 ml  Net  -2850 ml    Weight Status:  214.5 lbs/ 97.2 kg (increasing:  204 lb/ 92.5 kg on 01/18/11) Body mass index: 27.5 kg/(m^2). Overweight.  Re-estimated needs:   Kcals: 2300-2500 Protein: 115-130 g Fluid: 2.3-2.5 L  Pt reports mouth is feeling much better and he is able to eat again without pain. He stated he hopes to leave tomorrow (01/27/11). Wt is increasing with noted fluid retention and edema persists. PO intake is about 50% of meals and pt states he is eating the Ensure pudding TID.  Nutrition Dx:  Inadequate oral intake now related to sore mouth, thrush as evidenced by less than 50% meal intake.  Goal: Intake of meals and supplements to meet >90% of estimated needs, unmet.  Intervention:  Order 30 ml Prostat BID to provide 144 kcal, and 26 gm protein.  Monitor:  Diet advancement, PO intake, labs, weight trends   Oscar [name] Pager #:  [phone]  Oscar [name] [phone]

## 2011-01-26 NOTE — Plan of Care (Signed)
Problem: Inadequate Intake (NI-2.1) Goal: Food and/or nutrient delivery Individualized approach for food/nutrient provision.  Outcome: Progressing Pt is consuming 50% of meals and supplements. Pt weight is increasing however with noted fluid retention and edema present. Pt states thrush is improving and he is able to eat and drink without pain.

## 2011-01-27 DIAGNOSIS — I214 Non-ST elevation (NSTEMI) myocardial infarction: Secondary | ICD-10-CM

## 2011-01-27 LAB — BASIC METABOLIC PANEL
Calcium: 8.1 mg/dL — ABNORMAL LOW (ref 8.4–10.5)
Chloride: 95 mEq/L — ABNORMAL LOW (ref 96–112)
Creatinine, Ser: 1.16 mg/dL (ref 0.50–1.35)
GFR calc Af Amer: 73 mL/min — ABNORMAL LOW (ref >90)
Sodium: 133 mEq/L — ABNORMAL LOW (ref 135–145)

## 2011-01-27 LAB — GLUCOSE, CAPILLARY
Glucose-Capillary: 209 mg/dL — ABNORMAL HIGH (ref 70–99)
Glucose-Capillary: 220 mg/dL — ABNORMAL HIGH (ref 70–99)
Glucose-Capillary: 145 mg/dL — ABNORMAL HIGH (ref 70–99)
Glucose-Capillary: 168 mg/dL — ABNORMAL HIGH (ref 70–99)

## 2011-01-27 MED ORDER — POTASSIUM CHLORIDE CRYS ER 20 MEQ PO TBCR
40.0000 meq | EXTENDED_RELEASE_TABLET | Freq: Once | ORAL | Status: AC
Start: 2011-01-27 — End: 2011-01-27
  Administered 2011-01-27: 40 meq via ORAL
  Filled 2011-01-27: qty 2

## 2011-01-27 MED ORDER — WHITE PETROLATUM GEL
Status: AC
  Administered 2011-01-27: 17:00:00
  Filled 2011-01-27: qty 5

## 2011-01-27 NOTE — Progress Notes (Signed)
01/27/11  Consider starting Lantus 10 units daily.  hgbA1c is 13.1%  Poor glucose control at home.  Will need follow up by PCP. Smith Mince RN BSN

## 2011-01-27 NOTE — Progress Notes (Signed)
                    [address]            [place] [address]          [phone]     8 Days Post-Op Procedure(s) (LRB): VIDEO ASSISTED THORACOSCOPY (Left) VIDEO BRONCHOSCOPY (N/A)  Subjective: Feels weak.  Having loose stools this am after receiving laxatives and stool softeners for constipation.    Objective: Vital signs in last 24 hours: Patient Vitals for the past 24 hrs:  BP Temp Temp src Pulse Resp SpO2  01/27/11 0900 96/67 mmHg 98.7 F (37.1 C) Oral 112  24  -  01/27/11 0810 - - - - - 94 %  01/27/11 0800 96/57 mmHg 98.7 F (37.1 C) Oral - - -  01/27/11 0401 101/51 mmHg 97.5 F (36.4 C) Oral 106  23  91 %  01/27/11 0007 101/55 mmHg 98.6 F (37 C) Oral 112  24  90 %  01/26/11 2138 - - - 124  - -  01/26/11 2015 103/70 mmHg - - 108  24  90 %  01/26/11 2000 103/70 mmHg 98.1 F (36.7 C) Oral 111  31  90 %  01/26/11 1809 98/59 mmHg 98.8 F (37.1 C) Oral - - -  01/26/11 1305 - 97.8 F (36.6 C) Oral - - -   Current Weight  01/21/11 97.297 kg (214 lb 8 oz)     Intake/Output from previous day: 01/17 0701 - 01/18 0700 In: 970 [P.O.:720; IV Piggyback:250] Out: 2075 [Urine:2075]    PHYSICAL EXAM:  Heart: RRR,  tachy 100-120 Lungs: poor inspiratory effort, clear Wound: clean and dry   Lab Results: CBC:No results found for this basename: WBC:2,HGB:2,HCT:2,PLT:2 in the last 72 hours BMET:  Basename 01/27/11 0400 01/26/11 0430  NA 133* 134*  K 3.4* 3.2*  CL 95* 95*  CO2 32 33*  GLUCOSE 189* 183*  BUN 8 8  CREATININE 1.16 1.10  CALCIUM 8.1* 8.0*    PT/INR: No results found for this basename: LABPROT,INR in the last 72 hours   Assessment/Plan: S/P Procedure(s) (LRB): VIDEO ASSISTED THORACOSCOPY (Left) VIDEO BRONCHOSCOPY (N/A) ID- Continue Vanc/Zosyn.  PICC line in place. Pulm- continue pulm toilet. CV- cardiology following.  Tachy on beta blocker, but BPs borderline. GI- will hold laxatives and stool softeners today and  watch. Hypokalemia- replace K+.(ordered by cardiology)    LOS: 9 days    Dayami Taitt H 01/27/2011

## 2011-01-27 NOTE — Progress Notes (Signed)
Patient Name: Oscar [name] Date of Encounter: 01/27/2011      SUBJECTIVE: States CT site is sore. Denies dyspnea  OBJECTIVE  Filed Vitals:   01/26/11 2138 01/27/11 0007 01/27/11 0401 01/27/11 0810  BP:  101/55 101/51   Pulse: 124 112 106   Temp:  98.6 F (37 C) 97.5 F (36.4 C)   TempSrc:  Oral Oral   Resp:  24 23   Height:      Weight:      SpO2:  90% 91% 94%    Intake/Output Summary (Last 24 hours) at 01/27/11 0847 Last data filed at 01/27/11 0600  Gross per 24 hour  Intake    730 ml  Output   2075 ml  Net  -1345 ml   Weight change:   PHYSICAL EXAM  General: Well developed, well nourished, NAD Head: Normal Neck: Supple without bruits or JVD. Lungs: Diminished BS left lung field Heart: RRR no s3, s4, or murmurs. Abdomen: Soft, non-tender, non-distended, BS + x 4.  Msk:  Strength and tone appears normal for age. Extremities: 1+ pitting edema to mid-calf.  Neuro: Alert and oriented X 3. Moves all extremities spontaneously.  LABS:   Lab 01/27/11 0400 01/26/11 0430 01/24/11 0430 01/21/11 0412  NA 133* 134* 136 --  K 3.4* 3.2* 3.4* --  CL 95* 95* 96 --  CO2 32 33* 32 --  BUN 8 8 6  --  CREATININE 1.16 1.10 1.09 --  CALCIUM 8.1* 8.0* 8.1* --  PROT -- -- -- 5.5*  BILITOT -- -- -- 0.5  ALKPHOS -- -- -- 51  ALT -- -- -- 6  AST -- -- -- 13  AMYLASE -- -- -- --  LIPASE -- -- -- --  GLUCOSE 189* 183* 121* --    Current Medications:     . albuterol  2.5 mg Nebulization TID  . aspirin EC  81 mg Oral Daily  . bisacodyl  10 mg Oral Daily  . Chlorhexidine Gluconate Cloth  6 each Topical Q0600  . docusate sodium  100 mg Oral Daily  . enoxaparin  40 mg Subcutaneous Q24H  . feeding supplement  1 Container Oral TID WC  . feeding supplement  30 mL Oral BID WC  . folic acid  1 mg Oral Daily  . furosemide  40 mg Intravenous BID  . glipiZIDE  5 mg Oral BID AC  . guaiFENesin  1,200 mg Oral BID  . insulin aspart  0-24 Units Subcutaneous TID WC  .  lactulose  20 g Oral Once  . lactulose  30 g Oral Daily  . magic mouthwash  5 mL Oral QID  . metFORMIN  500 mg Oral BID WC  . metoprolol tartrate  25 mg Oral BID  . mupirocin ointment  1 application Nasal BID  . nicotine  21 mg Transdermal Daily  . pantoprazole  40 mg Oral Q1200  . piperacillin-tazobactam (ZOSYN)  IV  3.375 g Intravenous Q8H  . potassium chloride  20 mEq Oral BID  . rosuvastatin  20 mg Oral QHS  . sodium chloride  10 mL Intravenous Q12H  . sodium chloride  10-40 mL Intracatheter Q12H  . thiamine  100 mg Oral Daily  . vancomycin  1,000 mg Intravenous Q12H  . DISCONTD: albuterol  2.5 mg Nebulization QID    ASSESSMENT AND PLAN:   1. Empyema- on Vanc/Zosyn; PICC line for Vanc; management per CVTS  2. Fluid overload- Still with volume excess; continue lasix and  follow renal function. Supplement K. Can most likely reduce lasix to 40 mg daily in AM.  3. NSTEMI- believed to be demand ischemia secondary to acute illness, on Metoprolol, ASA, statin. Stress test after improves (as outpatient).  4. Tachycardia- sinus tachycardia, continue Metoprolol.  5. HTN- BP well-controlled, continue current meds  6. Decubitus ulcer- wound care has seen, following recs   Signed, R. Hurman Horn, PA-C 01/27/2011, 8:47 AM

## 2011-01-28 LAB — GLUCOSE, CAPILLARY
Glucose-Capillary: 198 mg/dL — ABNORMAL HIGH (ref 70–99)
Glucose-Capillary: 238 mg/dL — ABNORMAL HIGH (ref 70–99)
Glucose-Capillary: 207 mg/dL — ABNORMAL HIGH (ref 70–99)

## 2011-01-28 LAB — BASIC METABOLIC PANEL
GFR calc Af Amer: 69 mL/min — ABNORMAL LOW (ref >90)
GFR calc non Af Amer: 59 mL/min — ABNORMAL LOW (ref >90)
Potassium: 3.6 mEq/L (ref 3.5–5.1)
Sodium: 134 mEq/L — ABNORMAL LOW (ref 135–145)

## 2011-01-28 MED ORDER — FUROSEMIDE 40 MG PO TABS
40.0000 mg | ORAL_TABLET | Freq: Two times a day (BID) | ORAL | Status: DC
  Administered 2011-01-28 – 2011-01-29 (×2): 40 mg via ORAL
  Filled 2011-01-28 (×4): qty 1

## 2011-01-28 NOTE — Progress Notes (Addendum)
[address]            [place] [address]          [phone]     9 Days Post-Op  Procedure(s) (LRB): VIDEO ASSISTED THORACOSCOPY (Left) VIDEO BRONCHOSCOPY (N/A) Subjective: Continues to feel better, some sputum production  Objective   Temp:  [98.2 F (36.8 C)-99.3 F (37.4 C)] 98.9 F (37.2 C) (01/19 0743) Pulse Rate:  [59-104] 98  (01/19 0743) Resp:  [18-25] 18  (01/19 0743) BP: (92-109)/(49-68) 100/58 mmHg (01/19 0743) SpO2:  [92 %-100 %] 93 % (01/19 0931) Weight:  [180 lb 8.9 oz (81.9 kg)] 180 lb 8.9 oz (81.9 kg) (01/19 0400)   Intake/Output Summary (Last 24 hours) at 01/28/11 1207 Last data filed at 01/28/11 1000  Gross per 24 hour  Intake   1070 ml  Output   3725 ml  Net  -2655 ml       General appearance: alert, cooperative and no distress Heart: irregula(PVC's) Lungs: diminished in the bases Abdomen: soft, non-tender; bowel sounds normal; no masses,  no organomegaly Wound: incision healing well  Lab Results:  Basename 01/28/11 0500 01/27/11 0400  NA 134* 133*  K 3.6 3.4*  CL 96 95*  CO2 31 32  GLUCOSE 189* 189*  BUN 9 8  CREATININE 1.22 1.16  CALCIUM 8.0* 8.1*  MG -- --  PHOS -- --   No results found for this basename: AST:2,ALT:2,ALKPHOS:2,BILITOT:2,PROT:2,ALBUMIN:2 in the last 72 hours No results found for this basename: LIPASE:2,AMYLASE:2 in the last 72 hours No results found for this basename: WBC:2,NEUTROABS:2,HGB:2,HCT:2,MCV:2,PLT:2 in the last 72 hours No results found for this basename: CKTOTAL:4,CKMB:4,TROPONINI:4 in the last 72 hours No components found with this basename: POCBNP:3 No results found for this basename: DDIMER in the last 72 hours No results found for this basename: HGBA1C in the last 72 hours No results found for this basename: CHOL,HDL,LDLCALC,TRIG,CHOLHDL in the last 72 hours No results found for this basename: TSH,T4TOTAL,FREET3,T3FREE,THYROIDAB in the last 72 hours No results  found for this basename: VITAMINB12,FOLATE,FERRITIN,TIBC,IRON,RETICCTPCT in the last 72 hours  Medications: Scheduled    . albuterol  2.5 mg Nebulization TID  . aspirin EC  81 mg Oral Daily  . Chlorhexidine Gluconate Cloth  6 each Topical Q0600  . enoxaparin  40 mg Subcutaneous Q24H  . feeding supplement  1 Container Oral TID WC  . feeding supplement  30 mL Oral BID WC  . folic acid  1 mg Oral Daily  . furosemide  40 mg Oral BID  . glipiZIDE  5 mg Oral BID AC  . guaiFENesin  1,200 mg Oral BID  . insulin aspart  0-24 Units Subcutaneous TID WC  . magic mouthwash  5 mL Oral QID  . metFORMIN  500 mg Oral BID WC  . metoprolol tartrate  25 mg Oral BID  . mupirocin ointment  1 application Nasal BID  . nicotine  21 mg Transdermal Daily  . pantoprazole  40 mg Oral Q1200  . piperacillin-tazobactam (ZOSYN)  IV  3.375 g Intravenous Q8H  . potassium chloride  20 mEq Oral BID  . rosuvastatin  20 mg Oral QHS  . sodium chloride  10 mL Intravenous Q12H  . sodium chloride  10-40 mL Intracatheter Q12H  . thiamine  100 mg Oral Daily  . vancomycin  1,000 mg Intravenous Q12H  . white petrolatum      .  DISCONTD: bisacodyl  10 mg Oral Daily  . DISCONTD: docusate sodium  100 mg Oral Daily  . DISCONTD: furosemide  40 mg Intravenous BID  . DISCONTD: lactulose  20 g Oral Once     Radiology/Studies:  No results found.  INR: Will add last result for INR, ABG once components are confirmed Will add last 4 CBG results once components are confirmed  Assessment/Plan: S/P Procedure(s) (LRB): VIDEO ASSISTED THORACOSCOPY (Left) VIDEO BRONCHOSCOPY (N/A)  1. Cont to do well on current rx, cont current abx, and pulm rx  LOS: 10 days    Jadzia Ibsen E 1/19/201312:07 PM    Poss Home by Monday I have seen and examined Karena Addison and agree with the above assessment  and plan.  Delight Ovens MD Beeper [phone] Office [phone] 01/28/2011 6:32 PM

## 2011-01-28 NOTE — Progress Notes (Signed)
SUBJECTIVE:  Ate breakfast. Urinated quite a bit yesterday.  Denies any SOB or pain   PHYSICAL EXAM Filed Vitals:   01/27/11 2330 01/28/11 0400 01/28/11 0743 01/28/11 0931  BP: 109/49 98/68 100/58   Pulse: 59 94 98   Temp: 99 F (37.2 C) 98.4 F (36.9 C) 98.9 F (37.2 C)   TempSrc: Oral Oral Oral   Resp: 20 23 18    Height:      Weight:  81.9 kg (180 lb 8.9 oz)    SpO2: 92% 92% 93% 93%   General:  Weak but in no distress Lungs:  Clear Heart:  regular rate and rhythm with ectopy Abdomen:  Positive bowel sounds, no rebound no guarding Extremities:  No edema.  LABS: No results found for this basename: CKTOTAL, CKMB, CKMBINDEX, TROPONINI   Results for orders placed during the hospital encounter of 01/18/11 (from the past 24 hour(s))  GLUCOSE, CAPILLARY     Status: Abnormal   Collection Time   01/27/11 12:31 PM      Component Value Range   Glucose-Capillary 220 (*) 70 - 99 (mg/dL)   Comment 1 Documented in Chart     Comment 2 Notify RN    GLUCOSE, CAPILLARY     Status: Abnormal   Collection Time   01/27/11  4:30 PM      Component Value Range   Glucose-Capillary 145 (*) 70 - 99 (mg/dL)   Comment 1 Documented in Chart     Comment 2 Notify RN    GLUCOSE, CAPILLARY     Status: Abnormal   Collection Time   01/27/11  9:34 PM      Component Value Range   Glucose-Capillary 168 (*) 70 - 99 (mg/dL)   Comment 1 Notify RN     Comment 2 Documented in Chart    BASIC METABOLIC PANEL     Status: Abnormal   Collection Time   01/28/11  5:00 AM      Component Value Range   Sodium 134 (*) 135 - 145 (mEq/L)   Potassium 3.6  3.5 - 5.1 (mEq/L)   Chloride 96  96 - 112 (mEq/L)   CO2 31  19 - 32 (mEq/L)   Glucose, Bld 189 (*) 70 - 99 (mg/dL)   BUN 9  6 - 23 (mg/dL)   Creatinine, Ser 1.61  0.50 - 1.35 (mg/dL)   Calcium 8.0 (*) 8.4 - 10.5 (mg/dL)   GFR calc non Af Amer 59 (*) >90 (mL/min)   GFR calc Af Amer 69 (*) >90 (mL/min)  GLUCOSE, CAPILLARY     Status: Abnormal   Collection Time     01/28/11  7:34 AM      Component Value Range   Glucose-Capillary 198 (*) 70 - 99 (mg/dL)   Comment 1 Documented in Chart     Comment 2 Notify RN      Intake/Output Summary (Last 24 hours) at 01/28/11 1145 Last data filed at 01/28/11 1000  Gross per 24 hour  Intake   1070 ml  Output   3725 ml  Net  -2655 ml   Telemetry:   Sinus tachycardia with premature ectopic complexes but no sustained arrhthymias.  01/28/2011  ASSESSMENT AND PLAN: 1. Empyema- on Vanc/Zosyn; PICC line for Vanc; management per CVTS   2. Fluid overload- Excellent UO yesterday.  Change to po Lasix today.  3. NSTEMI- believed to be demand ischemia secondary to acute illness, on Metoprolol, ASA, statin. Stress test after improves (as outpatient).  4. Tachycardia- sinus tachycardia, continue Metoprolol.  BP soft so I will not increase the beta blocker today.    5. HTN- BP well-controlled, continue current meds      Rollene Rotunda 01/28/2011 11:45 AM

## 2011-01-29 DIAGNOSIS — I214 Non-ST elevation (NSTEMI) myocardial infarction: Secondary | ICD-10-CM

## 2011-01-29 LAB — GLUCOSE, CAPILLARY
Glucose-Capillary: 103 mg/dL — ABNORMAL HIGH (ref 70–99)
Glucose-Capillary: 210 mg/dL — ABNORMAL HIGH (ref 70–99)
Glucose-Capillary: 160 mg/dL — ABNORMAL HIGH (ref 70–99)
Glucose-Capillary: 174 mg/dL — ABNORMAL HIGH (ref 70–99)
Glucose-Capillary: 194 mg/dL — ABNORMAL HIGH (ref 70–99)

## 2011-01-29 LAB — VANCOMYCIN, TROUGH: Vancomycin Tr: 24.6 ug/mL — ABNORMAL HIGH (ref 10.0–20.0)

## 2011-01-29 MED ORDER — VANCOMYCIN HCL 1000 MG IV SOLR
750.0000 mg | Freq: Two times a day (BID) | INTRAVENOUS | Status: DC
  Administered 2011-01-30: 750 mg via INTRAVENOUS
  Filled 2011-01-29 (×3): qty 750

## 2011-01-29 MED ORDER — FUROSEMIDE 40 MG PO TABS
40.0000 mg | ORAL_TABLET | Freq: Every day | ORAL | Status: DC
  Administered 2011-01-30: 40 mg via ORAL
  Filled 2011-01-29: qty 1

## 2011-01-29 NOTE — Progress Notes (Signed)
[address]            [place] [address]          [phone]             10 Days Post-Op  Procedure(s) (LRB): VIDEO ASSISTED THORACOSCOPY (Left) VIDEO BRONCHOSCOPY (N/A) Subjective: Continues to feel better, some sputum production  Objective   Temp:  [98.2 F (36.8 C)-100.2 F (37.9 C)] 100.1 F (37.8 C) (01/20 0751) Pulse Rate:  [50-102] 92  (01/20 1230) Resp:  [21-27] 22  (01/20 1230) BP: (92-105)/(47-66) 97/49 mmHg (01/20 1230) SpO2:  [90 %-97 %] 97 % (01/20 1230)   Intake/Output Summary (Last 24 hours) at 01/29/11 1310 Last data filed at 01/29/11 1000  Gross per 24 hour  Intake    910 ml  Output      0 ml  Net    910 ml       General appearance: alert, cooperative and no distress Heart: irregula(PVC's) Lungs: diminished in the bases Abdomen: soft, non-tender; bowel sounds normal; no masses,  no organomegaly Wound: incision healing well  Lab Results:  Basename 01/28/11 0500 01/27/11 0400  NA 134* 133*  K 3.6 3.4*  CL 96 95*  CO2 31 32  GLUCOSE 189* 189*  BUN 9 8  CREATININE 1.22 1.16  CALCIUM 8.0* 8.1*  MG -- --  PHOS -- --   Medications: Scheduled    . albuterol  2.5 mg Nebulization TID  . aspirin EC  81 mg Oral Daily  . enoxaparin  40 mg Subcutaneous Q24H  . feeding supplement  1 Container Oral TID WC  . feeding supplement  30 mL Oral BID WC  . folic acid  1 mg Oral Daily  . furosemide  40 mg Oral Daily  . glipiZIDE  5 mg Oral BID AC  . guaiFENesin  1,200 mg Oral BID  . insulin aspart  0-24 Units Subcutaneous TID WC  . magic mouthwash  5 mL Oral QID  . metFORMIN  500 mg Oral BID WC  . metoprolol tartrate  25 mg Oral BID  . mupirocin ointment  1 application Nasal BID  . nicotine  21 mg Transdermal Daily  . pantoprazole  40 mg Oral Q1200  . piperacillin-tazobactam (ZOSYN)  IV  3.375 g Intravenous Q8H  . potassium chloride  20 mEq Oral BID  . rosuvastatin  20 mg Oral QHS  . sodium chloride  10 mL  Intravenous Q12H  . sodium chloride  10-40 mL Intracatheter Q12H  . thiamine  100 mg Oral Daily  . vancomycin  1,000 mg Intravenous Q12H  . DISCONTD: furosemide  40 mg Oral BID     Radiology/Studies:  No results found. Dg Chest 2 View  01/26/2011  *RADIOLOGY REPORT*  Clinical Data: Left lung drainage, weakness  CHEST - 2 VIEW  Comparison: January 25, 2011  Findings: The right IJ central line tip is stable at the cavoatrial junction.  The left chest tube has been removed.  No pneumothorax. A small left pleural effusion persists.  Oval mass-like opacity in the left mid lung remains present and represented a loculated pleural effusion in the fissure on the January 17, 2011 CT scan. Left basilar consolidated infiltrate remains present and not significantly changed.  A small right pleural effusion persists.  IMPRESSION: No pneumothorax after chest tube removal.  Small bilateral pleural effusions with loculated effusion  in the left major fissure.  No significant change in consolidated infiltrate at the left base.  Original Report Authenticated By: Brandon Melnick, M.D.    Assessment/Plan: S/P Procedure(s) (LRB): VIDEO ASSISTED THORACOSCOPY (Left) VIDEO BRONCHOSCOPY (N/A)  1. Cont to do well on current rx, cont current abx, and pulm rx  LOS: 11 days  Poss Home When home care issues  are resolved  Kandra Graven B 1/20/20131:10 PM  Beeper [phone] Office [phone] 01/29/2011 1:10 PM

## 2011-01-29 NOTE — Progress Notes (Signed)
   SUBJECTIVE:   Denies any SOB or pain.  He walked this am and is sitting up and looks much better.   PHYSICAL EXAM Filed Vitals:   01/29/11 0400 01/29/11 0727 01/29/11 0751 01/29/11 0752  BP: 102/52   105/54  Pulse:   50 77  Temp: 98.2 F (36.8 C)  100.1 F (37.8 C)   TempSrc:   Oral   Resp:   27 21  Height:      Weight:      SpO2:  91% 91% 90%   General:  Weak but in no distress Lungs:  Clear Heart:  Regular rate and rhythm with ectopy Abdomen:  Positive bowel sounds, no rebound no guarding Extremities:  No edema.  LABS:  Results for orders placed during the hospital encounter of 01/18/11 (from the past 24 hour(s))  GLUCOSE, CAPILLARY     Status: Abnormal   Collection Time   01/28/11 12:59 PM      Component Value Range   Glucose-Capillary 238 (*) 70 - 99 (mg/dL)   Comment 1 Documented in Chart     Comment 2 Notify RN    GLUCOSE, CAPILLARY     Status: Abnormal   Collection Time   01/28/11 10:22 PM      Component Value Range   Glucose-Capillary 207 (*) 70 - 99 (mg/dL)   Comment 1 Documented in Chart     Comment 2 Notify RN      Intake/Output Summary (Last 24 hours) at 01/29/11 1011 Last data filed at 01/29/11 0559  Gross per 24 hour  Intake    910 ml  Output      0 ml  Net    910 ml   Telemetry:   Sinus tachycardia with premature ectopic complexes rate is faster now that he is up.  More ectopy but no sustatained arrhythmias.  01/29/2011  ASSESSMENT AND PLAN: 1. Empyema- on Vanc (Day 7)/Zosyn (Day 11); PICC line for Vanc; management per CVTS   2. Fluid overload- Changed to po Lasix yesterday.  I will reduce to once daily  Continue and follow a BMET in the am.  3. NSTEMI- believed to be demand ischemia secondary to acute illness, on Metoprolol, ASA, statin. Stress test after improves (as outpatient).   4. Tachycardia- sinus tachycardia, continue Metoprolol.  BP soft so I will not increase the beta blocker today.    5. HTN- BP well-controlled, continue current  meds      Rollene Rotunda 01/29/2011 10:11 AM

## 2011-01-29 NOTE — Progress Notes (Signed)
ANTIBIOTIC CONSULT NOTE - FOLLOW UP  Pharmacy Consult for Vancomycin/zosyn Day #7/14 Indication: empyema s/p VATS  No Known Allergies  Patient Measurements: Height: 6\' 2"  (188 cm) Weight: 180 lb 8.9 oz (81.9 kg) IBW/kg (Calculated) : 82.2    Vital Signs: Temp: 100.1 F (37.8 C) (01/20 0751) Temp src: Oral (01/20 1230) BP: 97/49 mmHg (01/20 1230) Pulse Rate: 92  (01/20 1230) Intake/Output from previous day: 01/19 0701 - 01/20 0700 In: 1390 [P.O.:840; IV Piggyback:550] Out: 1000 [Urine:1000] Intake/Output from this shift: Total I/O In: 360 [P.O.:360] Out: -   Labs:  Basename 01/28/11 0500 01/27/11 0400  WBC -- --  HGB -- --  PLT -- --  LABCREA -- --  CREATININE 1.22 1.16   Estimated Creatinine Clearance: 67.1 ml/min (by C-G formula based on Cr of 1.22).  Basename 01/29/11 1210  VANCOTROUGH 24.6*  VANCOPEAK --  Drue Dun --  GENTTROUGH --  GENTPEAK --  GENTRANDOM --  TOBRATROUGH --  TOBRAPEAK --  TOBRARND --  AMIKACINPEAK --  AMIKACINTROU --  AMIKACIN --     Microbiology: Recent Results (from the past 720 hour(s))  BODY FLUID CULTURE     Status: Normal   Collection Time   01/19/11  2:00 PM      Component Value Range Status Comment   Specimen Description PLEURAL FLUID LEFT   Final    Special Requests NONE   Final    Gram Stain     Final    Value: FEW WBC PRESENT,BOTH PMN AND MONONUCLEAR     NO ORGANISMS SEEN   Culture NO GROWTH 3 DAYS   Final    Report Status 01/23/2011 FINAL   Final   BODY FLUID CULTURE     Status: Normal   Collection Time   01/19/11  2:27 PM      Component Value Range Status Comment   Specimen Description PLEURAL FLUID LEFT   Final    Special Requests FLUID ON SWAB LEFT PLEURAL FLUID NO 2   Final    Gram Stain     Final    Value: NO WBC SEEN     NO ORGANISMS SEEN   Culture     Final    Value: RARE METHICILLIN RESISTANT STAPHYLOCOCCUS AUREUS     Note: This organism DOES NOT demonstrate inducible Clindamycin resistance in  vitro. RIFAMPIN AND GENTAMICIN SHOULD NOT BE USED AS SINGLE DRUGS FOR TREATMENT OF STAPH INFECTIONS.     Note: CRITICAL RESULT CALLED TO, READ BACK BY AND VERIFIED WITH: RN C. GOLDEN ON 01/22/11 BY TEDAR   Report Status 01/22/2011 FINAL   Final    Organism ID, Bacteria METHICILLIN RESISTANT STAPHYLOCOCCUS AUREUS   Final   CULTURE, RESPIRATORY     Status: Normal   Collection Time   01/19/11  3:42 PM      Component Value Range Status Comment   Specimen Description BRONCHIAL WASHINGS LEFT LUNG   Final    Special Requests PATIENT ON FOLLOWING ZOSYN   Final    Gram Stain     Final    Value: FEW WBC PRESENT,BOTH PMN AND MONONUCLEAR     NO SQUAMOUS EPITHELIAL CELLS SEEN     NO ORGANISMS SEEN   Culture     Final    Value: FEW METHICILLIN RESISTANT STAPHYLOCOCCUS AUREUS     Note: RIFAMPIN AND GENTAMICIN SHOULD NOT BE USED AS SINGLE DRUGS FOR TREATMENT OF STAPH INFECTIONS. This organism DOES NOT demonstrate inducible Clindamycin resistance in vitro. CRITICAL RESULT CALLED  TO, READ BACK BY AND VERIFIED WITH: BROADNAX RN      1030AM 01/23/11 GUS   Report Status 01/23/2011 FINAL   Final    Organism ID, Bacteria METHICILLIN RESISTANT STAPHYLOCOCCUS AUREUS   Final   MRSA PCR SCREENING     Status: Normal   Collection Time   01/20/11  7:40 PM      Component Value Range Status Comment   MRSA by PCR NEGATIVE  NEGATIVE  Final     Anti-infectives     Start     Dose/Rate Route Frequency Ordered Stop   01/30/11 0200   vancomycin (VANCOCIN) 750 mg in sodium chloride 0.9 % 150 mL IVPB        750 mg 150 mL/hr over 60 Minutes Intravenous Every 12 hours 01/29/11 1350 02/06/11 0159   01/22/11 1200   vancomycin (VANCOCIN) IVPB 1000 mg/200 mL premix  Status:  Discontinued        1,000 mg 200 mL/hr over 60 Minutes Intravenous Every 12 hours 01/22/11 1107 01/29/11 1351   01/19/11 1700   vancomycin (VANCOCIN) IVPB 1000 mg/200 mL premix        1,000 mg 200 mL/hr over 60 Minutes Intravenous Every 12 hours 01/19/11  1653 01/19/11 1940   01/18/11 1800   piperacillin-tazobactam (ZOSYN) IVPB 3.375 g  Status:  Discontinued        3.375 g 12.5 mL/hr over 240 Minutes Intravenous 4 times per day 01/18/11 1644 01/18/11 1706   01/18/11 1800  piperacillin-tazobactam (ZOSYN) IVPB 3.375 g       3.375 g 12.5 mL/hr over 240 Minutes Intravenous 3 times per day 01/18/11 1706            Assessment: Empyema drained.  Day #7/14 vancomycin and Zosyn.  Renal fx stable.  Empeiic tx vancomycin 1gm q12 started 1/13.  Trough drawn prior to dose given today by peripheral stick.  Level 24.6 > goal 15-20.  Discussed with dr. Tyrone Sage - RX to dose    Goal of Therapy:  Vancomycin trough level 15-20 mcg/ml  Plan:  Vancomycin 750mg  q12 Zosyn 3.375 GM q8  Marcelino Scot 01/29/2011,1:56 PM

## 2011-01-30 LAB — GLUCOSE, CAPILLARY
Glucose-Capillary: 190 mg/dL — ABNORMAL HIGH (ref 70–99)
Glucose-Capillary: 250 mg/dL — ABNORMAL HIGH (ref 70–99)

## 2011-01-30 LAB — BASIC METABOLIC PANEL
BUN: 12 mg/dL (ref 6–23)
CO2: 29 mEq/L (ref 19–32)
Calcium: 8.4 mg/dL (ref 8.4–10.5)
Chloride: 96 mEq/L (ref 96–112)
Creatinine, Ser: 1.28 mg/dL (ref 0.50–1.35)
Glucose, Bld: 155 mg/dL — ABNORMAL HIGH (ref 70–99)

## 2011-01-30 MED ORDER — FOLIC ACID 1 MG PO TABS: 1.0000 mg | ORAL_TABLET | Freq: Every day | ORAL | Status: DC

## 2011-01-30 MED ORDER — ROSUVASTATIN CALCIUM 20 MG PO TABS: 20.0000 mg | ORAL_TABLET | Freq: Every day | ORAL | Status: DC

## 2011-01-30 MED ORDER — METOPROLOL TARTRATE 25 MG PO TABS: 25.0000 mg | ORAL_TABLET | Freq: Two times a day (BID) | ORAL | Status: DC

## 2011-01-30 MED ORDER — NICOTINE 21 MG/24HR TD PT24: 1.0000 | MEDICATED_PATCH | Freq: Every day | TRANSDERMAL | Status: DC

## 2011-01-30 MED ORDER — VANCOMYCIN HCL 1000 MG IV SOLR: 1500.0000 mg | INTRAVENOUS | Status: AC

## 2011-01-30 MED ORDER — ASPIRIN 81 MG PO TBEC: 81.0000 mg | DELAYED_RELEASE_TABLET | Freq: Every day | ORAL | Status: AC

## 2011-01-30 MED ORDER — OXYCODONE HCL 5 MG PO TABS: 5.0000 mg | ORAL_TABLET | Freq: Four times a day (QID) | ORAL | Status: AC | PRN

## 2011-01-30 MED ORDER — GUAIFENESIN ER 600 MG PO TB12: 1200.0000 mg | ORAL_TABLET | Freq: Two times a day (BID) | ORAL | Status: AC
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
## 2011-01-30 NOTE — Discharge Summary (Signed)
patient examined and medical record reviewed,agree with above note. Oscar [name],Oscar [name] 01/30/2011

## 2011-01-30 NOTE — Progress Notes (Signed)
   CARE MANAGEMENT NOTE 01/30/2011  Patient:  Oscar [name], Oscar [name]   Account Number:  [id number]  Date Initiated:  01/19/2011  Documentation initiated by:  [facility]  Subjective/Objective Assessment:   tx from [facility] with possible need forVATS.     Action/Plan:   PTA, PT INDEPENDENT, LIVES WITH SPOUSE.   Anticipated DC Date:  01/30/2011   Anticipated DC Plan:  HOME W HOME HEALTH SERVICES  In-house referral  Clinical Social Worker      DC Planning Services  CM consult      [facility] Choice  HOME HEALTH   Choice offered to / List presented to:  C-1 Patient   DME arranged  IV PUMP/EQUIPMENT      DME agency  OTHER - SEE NOTE     HH arranged  HH-1 RN  HH-2 PT      [facility] agency  [facility]   Status of service:  In process, will continue to follow Medicare Important Message given?   (If response is "NO", the following Medicare IM given date fields will be blank) Date Medicare IM given:   Date Additional Medicare IM given:    Discharge Disposition:  HOME W HOME HEALTH SERVICES  Per UR Regulation:  Reviewed for med. necessity/level of care/duration of stay  Comments:  01/30/11 Aurie Harroun,RN,BSN 1114 PT FOR DISCHARGE HOME TODAY WITH FAMILY AND HOME HEALTH AS ARRANGED.  PT WAS TRANFERRED FROM [place] WHERE HOME O2 WAS ARRANGED WITH [facility].  PT NOW WITH O2 SATS 92-94% ON ROOM AIR, AND NO LONGER WILL NEED HOME O2.  NOTIFIED DERRIAN WITH [facility]: HE WILL ARRANGE TO HAVE HOME O2 SET UP PICKED UP. WILL NOTIFY HOME HEALTH AGENCY AND INFUSION AGENCY OF DC HOME TODAY. Phone #[phone]  01/27/11 Dreon Pineda,RN,BSN 1030 FAXED REFERRALS FOR HOME HEALTH CARE AND IV ANTIBIOTIC INFUSIONS TO HOME HEALTH AGENCY.  [facility] TO PROVIDE HOME HEALTH RN AND HHPT. (PHONE [phone]) HOME IV CARE INFUSION CO. WILL PROVIDE IV VANCOMYCIN--PHONE ([phone]).  FAXED REFERRAL AND RX FOR VANCOMYCIN TO INFUSION COMPANY. HOPEFUL FOR DC ON MONDAY, 01/30/11. Phone  #[phone]   01/25/11 Jahkari Maclin,RN,BSN 1500 MET WITH PT TO DISCUSS DISCHARGE PLANS.  PT STATES HE LIVES WITH SUPPORTIVE WIFE, WHO WILL ASSIST WITH CARE AT DISCHARGE.  HE THINKS WIFE CAN ASSIST WITH IV INFUSIONS AT DISCHARGE.  PHYSICAL THERAPY RECOMMENDING HOME WITH HOME HEALTH AT DISCHARGE.  PT IS AGREEABLE TO THIS, AND DOES NOT THINK HE NEEDS SNF PLACEMENT.  HE HAS NO PREFERENCE FOR HOME HEALTH AGENCY.  SPOKE WITH PT'S WIFE, SANDRA Ziebell, [phone]) AND SHE CONFIRMS THAT SHE CAN BE TAUGHT TO DO IV ANTIBIOTIC INFUSIONS AT HOME BY HOME HEALTH RN.  WILL ARRANGE HHRN AND HHPT FOR HOME FOLLOW UP. Phone #[phone]   01-25-11 2pm Avie Arenas, RNBSN - [phone] UR completed.   01-20-11 11:20am Avie Arenas, RNBSN [phone] UR Completed.  Post op on vent.  01-19-11 2:30pm Avie Arenas, RNBSN [phone] UR completed.

## 2011-01-30 NOTE — Progress Notes (Addendum)
[address]            [place] [address]          [phone]     11 Days Post-Op  Procedure(s) (LRB): VIDEO ASSISTED THORACOSCOPY (Left) VIDEO BRONCHOSCOPY (N/A) Subjective: Feeling better each day, no SOB  Objective  Telemetry NSR with  freq PVC's   Temp:  [97.9 F (36.6 C)-99 F (37.2 C)] 97.9 F (36.6 C) (01/21 0400) Pulse Rate:  [89-104] 100  (01/21 0400) Resp:  [22-32] 29  (01/21 0400) BP: (95-125)/(49-69) 106/69 mmHg (01/21 0400) SpO2:  [92 %-97 %] 95 % (01/21 1040) Weight:  [174 lb 13.2 oz (79.3 kg)] 174 lb 13.2 oz (79.3 kg) (01/21 0400)   Intake/Output Summary (Last 24 hours) at 01/30/11 1100 Last data filed at 01/30/11 0400  Gross per 24 hour  Intake    800 ml  Output   1450 ml  Net   -650 ml       General appearance: alert, cooperative and no distress Heart: irreqular Lungs: diminished in bases Abdomen: soft, non-tender; bowel sounds normal; no masses,  no organomegaly Extremities: extremities normal, atraumatic, no cyanosis or edema Wound: incision healing well  Lab Results:  Basename 01/30/11 0350 01/28/11 0500  NA 131* 134*  K 3.7 3.6  CL 96 96  CO2 29 31  GLUCOSE 155* 189*  BUN 12 9  CREATININE 1.28 1.22  CALCIUM 8.4 8.0*  MG -- --  PHOS -- --   No results found for this basename: AST:2,ALT:2,ALKPHOS:2,BILITOT:2,PROT:2,ALBUMIN:2 in the last 72 hours No results found for this basename: LIPASE:2,AMYLASE:2 in the last 72 hours No results found for this basename: WBC:2,NEUTROABS:2,HGB:2,HCT:2,MCV:2,PLT:2 in the last 72 hours No results found for this basename: CKTOTAL:4,CKMB:4,TROPONINI:4 in the last 72 hours No components found with this basename: POCBNP:3 No results found for this basename: DDIMER in the last 72 hours No results found for this basename: HGBA1C in the last 72 hours No results found for this basename: CHOL,HDL,LDLCALC,TRIG,CHOLHDL in the last 72 hours No results found for this basename:  TSH,T4TOTAL,FREET3,T3FREE,THYROIDAB in the last 72 hours No results found for this basename: VITAMINB12,FOLATE,FERRITIN,TIBC,IRON,RETICCTPCT in the last 72 hours  Medications: Scheduled    . albuterol  2.5 mg Nebulization TID  . aspirin EC  81 mg Oral Daily  . enoxaparin  40 mg Subcutaneous Q24H  . feeding supplement  1 Container Oral TID WC  . feeding supplement  30 mL Oral BID WC  . folic acid  1 mg Oral Daily  . furosemide  40 mg Oral Daily  . glipiZIDE  5 mg Oral BID AC  . guaiFENesin  1,200 mg Oral BID  . insulin aspart  0-24 Units Subcutaneous TID WC  . magic mouthwash  5 mL Oral QID  . metFORMIN  500 mg Oral BID WC  . metoprolol tartrate  25 mg Oral BID  . nicotine  21 mg Transdermal Daily  . pantoprazole  40 mg Oral Q1200  . piperacillin-tazobactam (ZOSYN)  IV  3.375 g Intravenous Q8H  . potassium chloride  20 mEq Oral BID  . rosuvastatin  20 mg Oral QHS  . sodium chloride  10 mL Intravenous Q12H  . sodium chloride  10-40 mL Intracatheter Q12H  . thiamine  100 mg Oral Daily  . vancomycin  750 mg Intravenous Q12H  . DISCONTD: vancomycin  1,000 mg Intravenous Q12H  Radiology/Studies:  No results found.  INR: Will add last result for INR, ABG once components are confirmed Will add last 4 CBG results once components are confirmed  Assessment/Plan: S/P Procedure(s) (LRB): VIDEO ASSISTED THORACOSCOPY (Left) VIDEO BRONCHOSCOPY (N/A) 1 . The patient is doing well. He will be discharged on today's date on vancomycin for 14 additional days through his PICC line. 2. We'll DC chest staples today. l, LOS: 12 days    Oscar [name] E 1/21/201311:00 AM

## 2011-01-30 NOTE — Progress Notes (Signed)
Inpatient Diabetes Program Recommendations  AACE/ADA: New Consensus Statement on Inpatient Glycemic Control (2009)  Target Ranges:  Prepandial:   less than 140 mg/dL      Peak postprandial:   less than 180 mg/dL (1-2 hours)      Critically ill patients:  140 - 180 mg/dL   Reason for Visit: Hyperglycemia  Inpatient Diabetes Program Recommendations Insulin - Basal: CBG's before breakfast still suboptimal.  Please consider adding Lantus 10 units  HgbA1C: Note A1C 13.1% indicating poor glycemic control prior to admit. Will need follow-up as outpatient  Note: Results for CEFERINO, LANG (MRN [id number]) as of 01/30/2011 10:19  Ref. Range 01/29/2011 07:49 01/29/2011 12:28 01/29/2011 17:01 01/29/2011 21:51 01/30/2011 08:19  Glucose-Capillary Latest Range: 70-99 mg/dL 409 (H) 811 (H) 914 (H) 194 (H) 190 (H)

## 2011-01-30 NOTE — Progress Notes (Signed)
   CARE MANAGEMENT NOTE 01/30/2011  Patient:  Oscar [name], Oscar [name]   Account Number:  [id number]  Date Initiated:  01/19/2011  Documentation initiated by:  [facility]  Subjective/Objective Assessment:   tx from [facility] with possible need forVATS.     Action/Plan:   PTA, PT INDEPENDENT, LIVES WITH SPOUSE.   Anticipated DC Date:  01/30/2011   Anticipated DC Plan:  HOME W HOME HEALTH SERVICES  In-house referral  Clinical Social Worker      DC Planning Services  CM consult      [facility] Choice  HOME HEALTH   Choice offered to / List presented to:  C-1 Patient   DME arranged  IV PUMP/EQUIPMENT  OXYGEN      DME agency  OTHER - SEE NOTE  [facility]     HH arranged  HH-1 RN  HH-2 PT      [facility] agency  [facility]   Status of service:  In process, will continue to follow Medicare Important Message given?   (If response is "NO", the following Medicare IM given date fields will be blank) Date Medicare IM given:   Date Additional Medicare IM given:    Discharge Disposition:  HOME W HOME HEALTH SERVICES  Per UR Regulation:  Reviewed for med. necessity/level of care/duration of stay  Comments:  01/27/11 Toy Eisemann,RN,BSN 1030 FAXED REFERRALS FOR HOME HEALTH CARE AND IV ANTIBIOTIC INFUSIONS TO HOME HEALTH AGENCY.  [facility] TO PROVIDE HOME HEALTH RN AND HHPT. (PHONE [phone]) HOME IV CARE INFUSION CO[facility] WILL PROVIDE IV VANCOMYCIN--PHONE [phone]).  FAXED REFERRAL AND RX FOR VANCOMYCIN TO INFUSION COMPANY. HOPEFUL FOR DC ON MONDAY, 01/30/11. Phone #[phone]  01/25/11 Jahari Wiginton,RN,BSN 1500 MET WITH PT TO DISCUSS DISCHARGE PLANS.  PT STATES HE LIVES WITH SUPPORTIVE WIFE, WHO WILL ASSIST WITH CARE AT DISCHARGE.  HE THINKS WIFE CAN ASSIST WITH IV INFUSIONS AT DISCHARGE.  PHYSICAL THERAPY RECOMMENDING HOME WITH HOME HEALTH AT DISCHARGE.  PT IS AGREEABLE TO THIS, AND DOES NOT THINK HE NEEDS SNF PLACEMENT.  HE HAS NO  PREFERENCE FOR HOME HEALTH AGENCY.  SPOKE WITH PT'S WIFE, SANDRA Geraghty, [phone]) AND SHE CONFIRMS THAT SHE CAN BE TAUGHT TO DO IV ANTIBIOTIC INFUSIONS AT HOME BY HOME HEALTH RN.  WILL ARRANGE HHRN AND HHPT FOR HOME FOLLOW UP. Phone #[phone]   01-25-11 2pm Avie Arenas, RNBSN - [phone] UR completed.   01-20-11 11:20am Avie Arenas, RNBSN [phone] UR Completed.  Post op on vent.  01-19-11 2:30pm Avie Arenas, RNBSN [phone] UR completed.

## 2011-01-30 NOTE — Progress Notes (Signed)
Physical Therapy Treatment Patient Details Name: Oscar [name] MRN: [id number] DOB: 11-May-1940 Today's Date: 01/30/2011  PT Assessment/Plan  PT - Assessment/Plan Comments on Treatment Session: Patient with improved safety with functional mobility. I recommend a rolling walker for discharge for safety, but with HH PT to follow patient at home to maximize functional independence without a device. Patient continuing to self limit activity and needs max encouragement to mobilize. PT Plan: Discharge plan remains appropriate;Frequency remains appropriate PT Frequency: Min 3X/week Follow Up Recommendations: Home health PT;Supervision - Intermittent Equipment Recommended: Rolling walker with 5" wheels PT Goals  Acute Rehab PT Goals PT Goal: Rolling Supine to Right Side - Progress: Met PT Goal: Supine/Side to Sit - Progress: Met PT Goal: Sit to Stand - Progress: Progressing toward goal PT Goal: Stand to Sit - Progress: Progressing toward goal PT Goal: Stand - Progress: Met PT Goal: Ambulate - Progress: Progressing toward goal  PT Treatment Precautions/Restrictions  Precautions Precautions: Fall Precaution Comments: Chest tube to water seal Restrictions Weight Bearing Restrictions: No Mobility (including Balance) Bed Mobility Rolling Right: 7: Independent Right Sidelying to Sit: 7: Independent Transfers Sit to Stand: 5: Supervision;From bed;With upper extremity assist;From chair/3-in-1 Sit to Stand Details (indicate cue type and reason): Only for re-education of hand placement Stand to Sit: 5: Supervision;With upper extremity assist;To chair/3-in-1 Stand to Sit Details: Only for re-education of hand placement Ambulation/Gait Ambulation/Gait Assistance: 5: Supervision Ambulation/Gait Assistance Details (indicate cue type and reason): No evidence of imbalance with negotiating obstacles or change of speed/turns Ambulation Distance (Feet): 300 Feet Assistive device: Rolling walker    Static Sitting Balance Static Sitting - Balance Support: Feet supported;No upper extremity supported Static Sitting - Level of Assistance: 7: Independent Static Standing Balance Static Standing - Balance Support: No upper extremity supported Static Standing - Level of Assistance: 7: Independent End of Session PT - End of Session Equipment Utilized During Treatment: Gait belt Activity Tolerance: Patient tolerated treatment well Patient left: in chair;with call bell in reach General Behavior During Session: [facility] for tasks performed Cognition: [facility] for tasks performed  Edwyna Perfect, PT  Pager [phone] 01/30/2011, 11:05 AM

## 2011-02-14 ENCOUNTER — Other Ambulatory Visit: Payer: Self-pay | Admitting: Cardiothoracic Surgery

## 2011-02-14 DIAGNOSIS — J869 Pyothorax without fistula: Secondary | ICD-10-CM

## 2011-02-15 ENCOUNTER — Encounter: Payer: Self-pay | Admitting: Cardiothoracic Surgery

## 2011-02-15 ENCOUNTER — Ambulatory Visit (INDEPENDENT_AMBULATORY_CARE_PROVIDER_SITE_OTHER): Payer: Self-pay | Admitting: Cardiothoracic Surgery

## 2011-02-15 ENCOUNTER — Ambulatory Visit
Admission: RE | Admit: 2011-02-15 | Discharge: 2011-02-15 | Disposition: A | Discharge status: Home / Self Care | Payer: Medicare (Managed Care) | Source: Ambulatory Visit | Attending: Cardiothoracic Surgery | Admitting: Cardiothoracic Surgery

## 2011-02-15 VITALS — BP 110/74 | HR 58 | Resp 20 | Ht 74.0 in | Wt 180.0 lb

## 2011-02-15 DIAGNOSIS — Z9889 Other specified postprocedural states: Secondary | ICD-10-CM

## 2011-02-15 DIAGNOSIS — J869 Pyothorax without fistula: Secondary | ICD-10-CM

## 2011-02-15 DIAGNOSIS — J189 Pneumonia, unspecified organism: Secondary | ICD-10-CM

## 2011-02-15 NOTE — Patient Instructions (Signed)
You may drive Do not smoke

## 2011-02-15 NOTE — Progress Notes (Signed)
HPI:                     [address]            [place] [address] Current problems  1 status post left baths decortication and drainage of empyema January 2013  2 COPD, reformed smoker 3 mild elevation cardiac enzymes with PVCs during hospitalization for left empyema 4  diabetes mellitus      The patient returns for his first office visit after being hospitalized last month for a MRSA positive empyema requiring baths decortication and drainage in 2 weeks of post discharge IV vancomycin by home health nursing via a PICC line. He states he is stop smoking. He denies fever. His overall strength and appetite is improved. He is breathing comfortably on room air. The surgical baths incision is healed. Current Outpatient Prescriptions  Medication Sig Dispense Refill  . aspirin EC 81 MG EC tablet Take 1 tablet (81 mg total) by mouth daily.      . folic acid (FOLVITE) 1 MG tablet Take 1 tablet (1 mg total) by mouth daily.  30 tablet  1  . glipiZIDE (GLUCOTROL) 5 MG tablet Take 5 mg by mouth 2 (two) times daily before a meal.      . guaiFENesin (MUCINEX) 600 MG 12 hr tablet Take 2 tablets (1,200 mg total) by mouth 2 (two) times daily.  30 tablet  0  . metFORMIN (GLUCOPHAGE) 850 MG tablet Take 850 mg by mouth 2 (two) times daily with a meal.      . metoprolol tartrate (LOPRESSOR) 25 MG tablet Take 1 tablet (25 mg total) by mouth 2 (two) times daily.  60 tablet  1  . omeprazole (PRILOSEC) 20 MG capsule Take 20 mg by mouth daily.      Marland Kitchen oxyCODONE (OXY IR/ROXICODONE) 5 MG immediate release tablet Take 5 mg by mouth every 6 (six) hours as needed.       . rosuvastatin (CRESTOR) 20 MG tablet Take 1 tablet (20 mg total) by mouth at bedtime.  30 tablet  1  . vancomycin (VANCOCIN) 10 G SOLR       . nicotine (NICODERM CQ - DOSED IN MG/24 HOURS) 21 mg/24hr patch Place 1 patch (21 patches total) onto the skin daily. Place 1 patch daily and remove each day prior to placing new patch  28 patch        Physical Exam: Vital signs blood pressure 110/70 pulse 60 and regular saturation room air 97% temperature afebrile weight 180 pounds Alert and comfortable breath sounds slightly diminished at the left base Cardiac regular rhythm Thoracotomy incision well-healed staples are removed PICC line site clean and dry PICC line is removed  Diagnostic Tests: Chest x-ray shows improved aeration of the left base no evidence recurrent effusion or empyema no infiltrate or pneumonia  Impression: Stable course following left baths decortication of MRSA positive empyema. The patient is stop smoking.  Plan: Followup exam in chest x-ray in one month. No prescriptions provided at this office visit

## 2011-02-17 ENCOUNTER — Encounter: Payer: Self-pay | Admitting: Cardiothoracic Surgery

## 2011-02-28 ENCOUNTER — Telehealth: Payer: Self-pay | Admitting: *Deleted

## 2011-02-28 ENCOUNTER — Encounter: Payer: Self-pay | Admitting: Cardiovascular Disease

## 2011-02-28 ENCOUNTER — Ambulatory Visit (INDEPENDENT_AMBULATORY_CARE_PROVIDER_SITE_OTHER): Payer: Medicare (Managed Care) | Admitting: Cardiovascular Disease

## 2011-02-28 ENCOUNTER — Encounter: Payer: Self-pay | Admitting: *Deleted

## 2011-02-28 ENCOUNTER — Other Ambulatory Visit: Payer: Self-pay | Admitting: Cardiovascular Disease

## 2011-02-28 VITALS — BP 107/67 | HR 84 | Ht 74.0 in | Wt 180.0 lb

## 2011-02-28 DIAGNOSIS — I219 Acute myocardial infarction, unspecified: Secondary | ICD-10-CM

## 2011-02-28 DIAGNOSIS — R0600 Dyspnea, unspecified: Secondary | ICD-10-CM | POA: Insufficient documentation

## 2011-02-28 DIAGNOSIS — E7800 Pure hypercholesterolemia, unspecified: Secondary | ICD-10-CM

## 2011-02-28 DIAGNOSIS — I493 Ventricular premature depolarization: Secondary | ICD-10-CM | POA: Insufficient documentation

## 2011-02-28 DIAGNOSIS — R0602 Shortness of breath: Secondary | ICD-10-CM

## 2011-02-28 DIAGNOSIS — E785 Hyperlipidemia, unspecified: Secondary | ICD-10-CM | POA: Insufficient documentation

## 2011-02-28 MED ORDER — SIMVASTATIN 20 MG PO TABS: 20.0000 mg | ORAL_TABLET | Freq: Every evening | ORAL | Status: DC

## 2011-02-28 NOTE — Assessment & Plan Note (Signed)
Patient continues to complain of dyspnea without chest pain. He had a recent small type II non-ST elevation myocardial infarction due to supply demand ischemia. However, he has multiple risk factors for coronary artery disease which will need to be evaluated. I recommend continuing aspirin daily and metoprolol 25 mg twice daily. I will request a Lexiscan nuclear stress test for further evaluation. The patient is not able to exercise on a treadmill.

## 2011-02-28 NOTE — Assessment & Plan Note (Signed)
His ejection fraction was normal by echo. We have to rule out underlying ischemia. Continue current dose of metoprolol.

## 2011-02-28 NOTE — Patient Instructions (Signed)
   Lexiscan stress test   Stop Crestor  Change to Simvastatin 20mg  every evening Your physician recommends that you go to the [facility] for lab work in 6 weeks for fasting lipid & liver panel.   If the results of your test are normal or stable, you will receive a letter.  If they are abnormal, the nurse will contact you by phone. Your physician wants you to follow up in: 6 months.  You will receive a reminder letter in the mail one-two months in advance.  If you don't receive a letter, please call our office to schedule the follow up appointment

## 2011-02-28 NOTE — Telephone Encounter (Signed)
Lexiscan stress test  Scheduled for 03-28-2011 @ [facility] Checking percert

## 2011-02-28 NOTE — Assessment & Plan Note (Signed)
The patient has not been taking Crestor as he could not afford it. I will start him on simvastatin 20 mg once daily. I will request fasting lipid and liver profile in 6 weeks.

## 2011-02-28 NOTE — Progress Notes (Signed)
HPI  This is a 71 year old man who is here today for followup visit. He presented in January of this year to [facility] with pneumonia complicated by left-sided empyema. We saw him during that hospitalization due to tachycardia, PACs and PVCs as well as mildly elevated troponin likely due to type II non-ST elevation myocardial infarction. The patient was treated with metoprolol. He had an echocardiogram done which showed normal LV systolic function. He was ultimately transferred to [facility] where he underwent VATS with pleural decortication. He was treated with antibiotics. He had a prolonged hospitalization lasting about 3 weeks total. The patient has been doing reasonably well since his hospital discharge. However, he continues to feel weak with dyspnea and occasional orthopnea. He denies chest pain or palpitations. He is off antibiotics and reports no productive cough, fevers or chills. He has chronic medical conditions that include type 2 diabetes, hypertension and hyperlipidemia.  No Known Allergies   Current Outpatient Prescriptions on File Prior to Visit  Medication Sig Dispense Refill  . aspirin EC 81 MG EC tablet Take 1 tablet (81 mg total) by mouth daily.      . folic acid (FOLVITE) 1 MG tablet Take 1 tablet (1 mg total) by mouth daily.  30 tablet  1  . glipiZIDE (GLUCOTROL) 5 MG tablet Take 5 mg by mouth 2 (two) times daily before a meal.      . guaiFENesin (MUCINEX) 600 MG 12 hr tablet Take 2 tablets (1,200 mg total) by mouth 2 (two) times daily.  30 tablet  0  . omeprazole (PRILOSEC) 20 MG capsule Take 20 mg by mouth daily.      Marland Kitchen oxyCODONE (OXY IR/ROXICODONE) 5 MG immediate release tablet Take 5 mg by mouth every 6 (six) hours as needed.       . metoprolol tartrate (LOPRESSOR) 25 MG tablet Take 1 tablet (25 mg total) by mouth 2 (two) times daily.  60 tablet  1  . rosuvastatin (CRESTOR) 20 MG tablet Take 1 tablet (20 mg total) by mouth at bedtime.  30 tablet  1      Past Medical History  Diagnosis Date  . Hypertension   . Diabetes mellitus   . Bronchitis   . GERD (gastroesophageal reflux disease)   . Hemorrhoid   . Arthritis     "fingers"  . COPD (chronic obstructive pulmonary disease)   . Acute renal insufficiency     During admission 01/2011  . Empyema lung 01/2011  . PVC's (premature ventricular contractions)      Past Surgical History  Procedure Date  . Appendectomy 1990's  . Tonsillectomy     "when I was a kid"  . Video assisted thoracoscopy 01/19/2011    Procedure: VIDEO ASSISTED THORACOSCOPY;  Surgeon: Kathlee Nations Suann Larry, MD;  Location: [facility] OR;  Service: Thoracic;  Laterality: Left;  . Video bronchoscopy 01/19/2011    Procedure: VIDEO BRONCHOSCOPY;  Surgeon: Mikey Bussing, MD;  Location: [facility] OR;  Service: Thoracic;  Laterality: N/A;     No family history on file.   History   Social History  . Marital Status: Married    Spouse Name: N/A    Number of Children: N/A  . Years of Education: N/A   Occupational History  . Not on file.   Social History Main Topics  . Smoking status: Former Smoker -- 1.0 packs/day for 53 years    Types: Cigarettes    Quit date: 01/24/2011  . Smokeless tobacco: Never  Used  . Alcohol Use: 3.6 oz/week    6 Shots of liquor per week     01/18/11 "half gallon moonshine on a good weekend; last time I drank anything was over Christmas"  . Drug Use: No  . Sexually Active: Not Currently   Other Topics Concern  . Not on file   Social History Narrative  . No narrative on file     PHYSICAL EXAM   BP 107/67  Pulse 84  Ht 6\' 2"  (1.88 m)  Wt 180 lb (81.647 kg)  BMI 23.11 kg/m2  SpO2 97% Constitutional: He is oriented to person, place, and time. He appears well-developed and well-nourished. No distress.  HENT: No nasal discharge.  Head: Normocephalic and atraumatic.  Eyes: Pupils are equal and round. Right eye exhibits no discharge. Left eye exhibits no discharge.  Neck: Normal  range of motion. Neck supple. No JVD present. No thyromegaly present.  Cardiovascular: Normal rate, regular rhythm, normal heart sounds and. Exam reveals no gallop and no friction rub. No murmur heard.  Pulmonary/Chest: Effort normal. There is slightly diminished breath sounds at the left base. No stridor. No respiratory distress. He has no wheezes. He has no rales. He exhibits no tenderness.  Abdominal: Soft. Bowel sounds are normal. He exhibits no distension. There is no tenderness. There is no rebound and no guarding.  Musculoskeletal: Normal range of motion. He exhibits no edema and no tenderness.  Neurological: He is alert and oriented to person, place, and time. Coordination normal.  Skin: Skin is warm and dry. No rash noted. He is not diaphoretic. No erythema. No pallor.  Psychiatric: He has a normal mood and affect. His behavior is normal. Judgment and thought content normal.      EKG:  Normal sinus rhythm with frequent PVCs.   ASSESSMENT AND PLAN

## 2011-03-13 ENCOUNTER — Other Ambulatory Visit: Payer: Self-pay | Admitting: Cardiothoracic Surgery

## 2011-03-14 ENCOUNTER — Other Ambulatory Visit: Payer: Self-pay | Admitting: Cardiothoracic Surgery

## 2011-03-14 DIAGNOSIS — J869 Pyothorax without fistula: Secondary | ICD-10-CM

## 2011-03-15 ENCOUNTER — Ambulatory Visit: Payer: Self-pay | Admitting: Cardiothoracic Surgery

## 2011-03-22 NOTE — Telephone Encounter (Signed)
Pt has [employer].  Per Fernande Boyden, [phone], no precert required.  [phone] for phone call only.

## 2011-03-27 ENCOUNTER — Other Ambulatory Visit: Payer: Self-pay | Admitting: Cardiothoracic Surgery

## 2011-03-28 ENCOUNTER — Other Ambulatory Visit: Payer: Self-pay | Admitting: *Deleted

## 2011-03-28 DIAGNOSIS — R072 Precordial pain: Secondary | ICD-10-CM

## 2011-03-28 MED ORDER — FOLIC ACID 1 MG PO TABS: 1.0000 mg | ORAL_TABLET | Freq: Every day | ORAL | Status: DC

## 2011-03-31 ENCOUNTER — Encounter: Payer: Self-pay | Admitting: *Deleted

## 2011-04-03 MED ORDER — FOLIC ACID 1 MG PO TABS: 1.0000 mg | ORAL_TABLET | Freq: Every day | ORAL | Status: DC

## 2011-04-03 NOTE — Telephone Encounter (Signed)
Addended by: Eustace Moore on: 04/03/2011 03:32 PM   Modules accepted: Orders

## 2011-05-04 ENCOUNTER — Other Ambulatory Visit: Payer: Self-pay | Admitting: Cardiothoracic Surgery

## 2011-05-16 ENCOUNTER — Encounter: Payer: Self-pay | Admitting: *Deleted

## 2011-05-23 ENCOUNTER — Other Ambulatory Visit: Payer: Self-pay | Admitting: *Deleted

## 2011-05-23 MED ORDER — METOPROLOL TARTRATE 25 MG PO TABS: 25.0000 mg | ORAL_TABLET | Freq: Two times a day (BID) | ORAL | Status: DC

## 2011-06-28 ENCOUNTER — Telehealth: Payer: Self-pay | Admitting: *Deleted

## 2011-06-28 NOTE — Telephone Encounter (Signed)
Informed patient that his labs are pass due and informed him to go to [facility] in the morning to have done. Patient verbalized understanding.

## 2011-10-23 ENCOUNTER — Other Ambulatory Visit: Payer: Self-pay | Admitting: Cardiovascular Disease

## 2012-01-23 ENCOUNTER — Telehealth: Payer: Self-pay | Admitting: *Deleted

## 2012-01-23 NOTE — Telephone Encounter (Signed)
Left message for patient to call office.  

## 2012-04-25 ENCOUNTER — Other Ambulatory Visit: Payer: Self-pay | Admitting: Cardiovascular Disease

## 2012-06-04 ENCOUNTER — Other Ambulatory Visit: Payer: Self-pay | Admitting: Cardiovascular Disease

## 2013-04-01 ENCOUNTER — Other Ambulatory Visit: Payer: Self-pay | Admitting: Cardiology

## 2013-04-01 NOTE — Telephone Encounter (Signed)
Received faxed refill authorization for metoprolol. Attempted to call pt to see if pt would schedule an appointment. Pt did not answer and a message was left on pt voicemail for pt to return call. According to care everywhere pt is seeing cardiologist with [facility] will not authorize refill until pt schedules an appointment.

## 2013-10-16 IMAGING — CR DG CHEST 1V PORT
1 series · 1 of 1 positions shown · non-contrast
Comparison: Yesterday

CLINICAL DATA: Postop video assisted thoracoscopic surgery

PORTABLE CHEST - 1 VIEW

[AP]
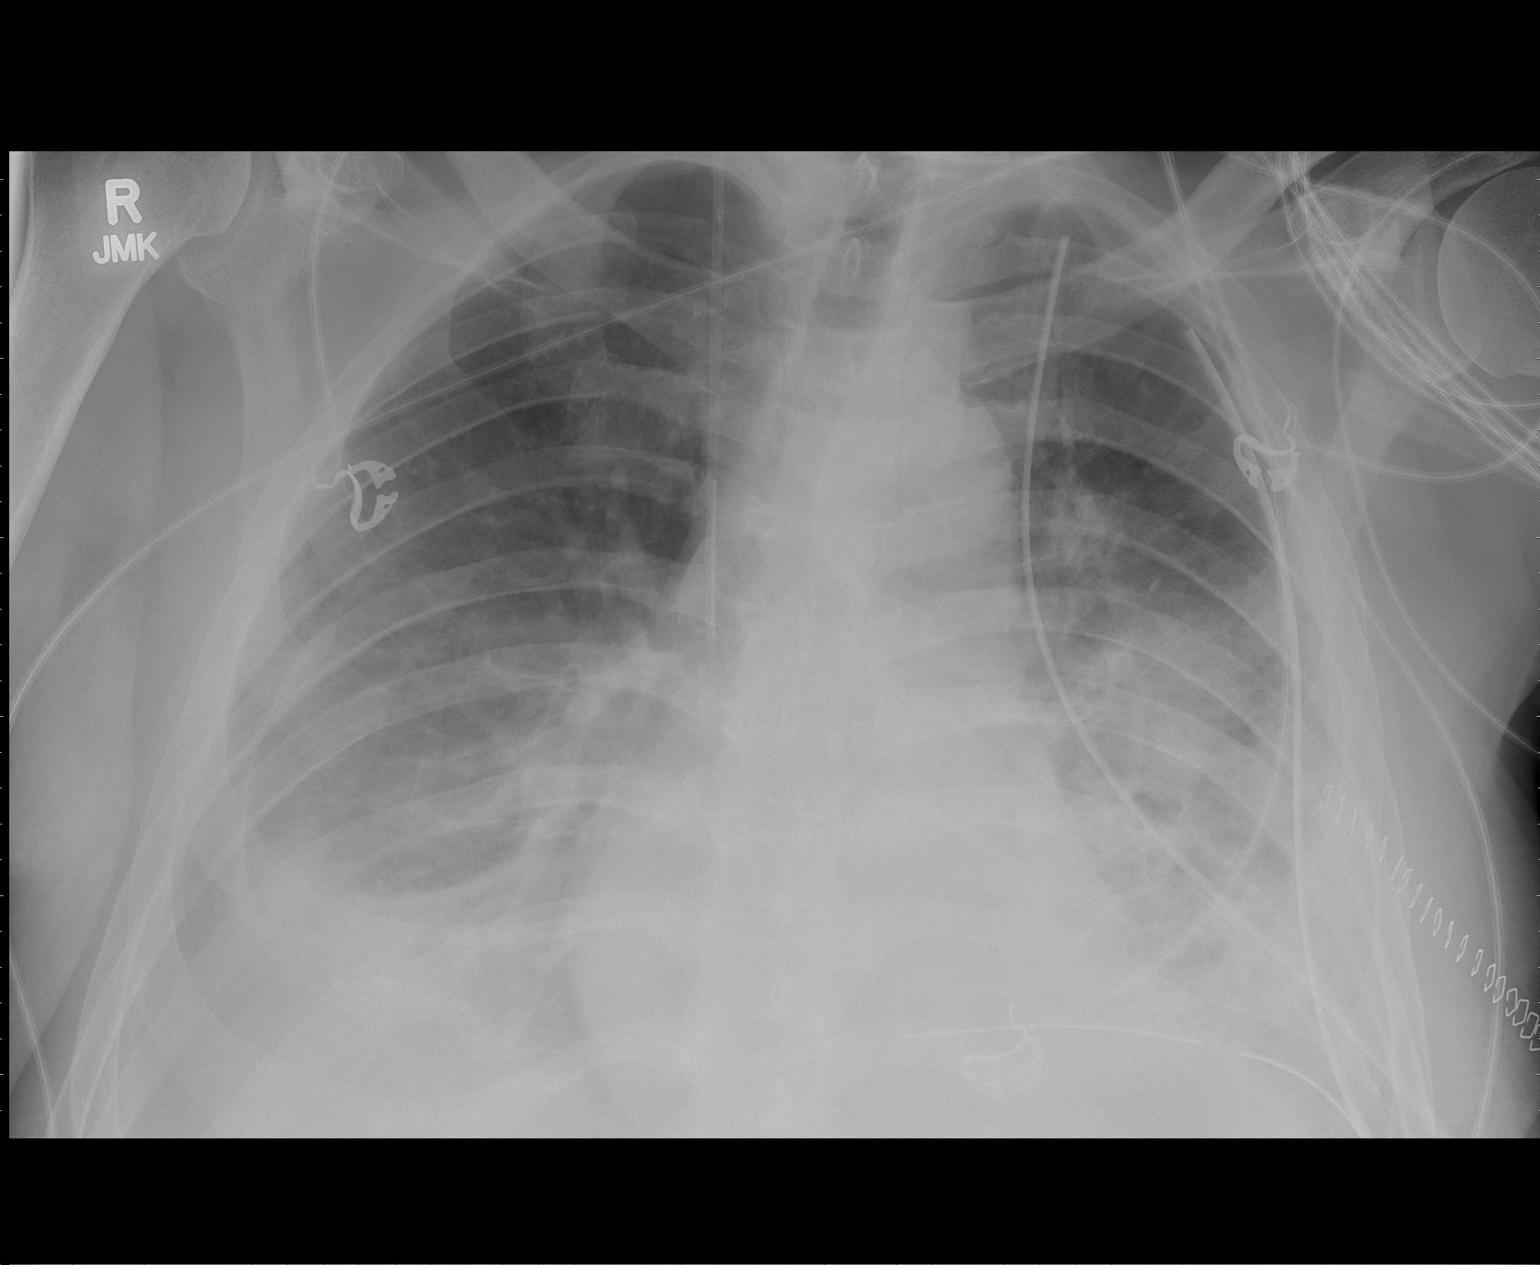

[1 of 1 positions shown; findings below may reference images not displayed]

FINDINGS: Endotracheal and NG tubes removed.  Tubular devices
otherwise stable.  Bilateral opacities compatible with a
combination of airspace disease and pleural fluid unchanged.  No
pneumothorax.
IMPRESSION: Extubated.  Bilateral pulmonary opacity unchanged.  No
pneumothorax.

## 2013-10-18 IMAGING — CR DG CHEST 1V PORT
1 series · 1 of 1 positions shown · non-contrast
Comparison: 01/22/2011

CLINICAL DATA: VATS and drainage of empyema.

PORTABLE CHEST - 1 VIEW

[view not recorded]
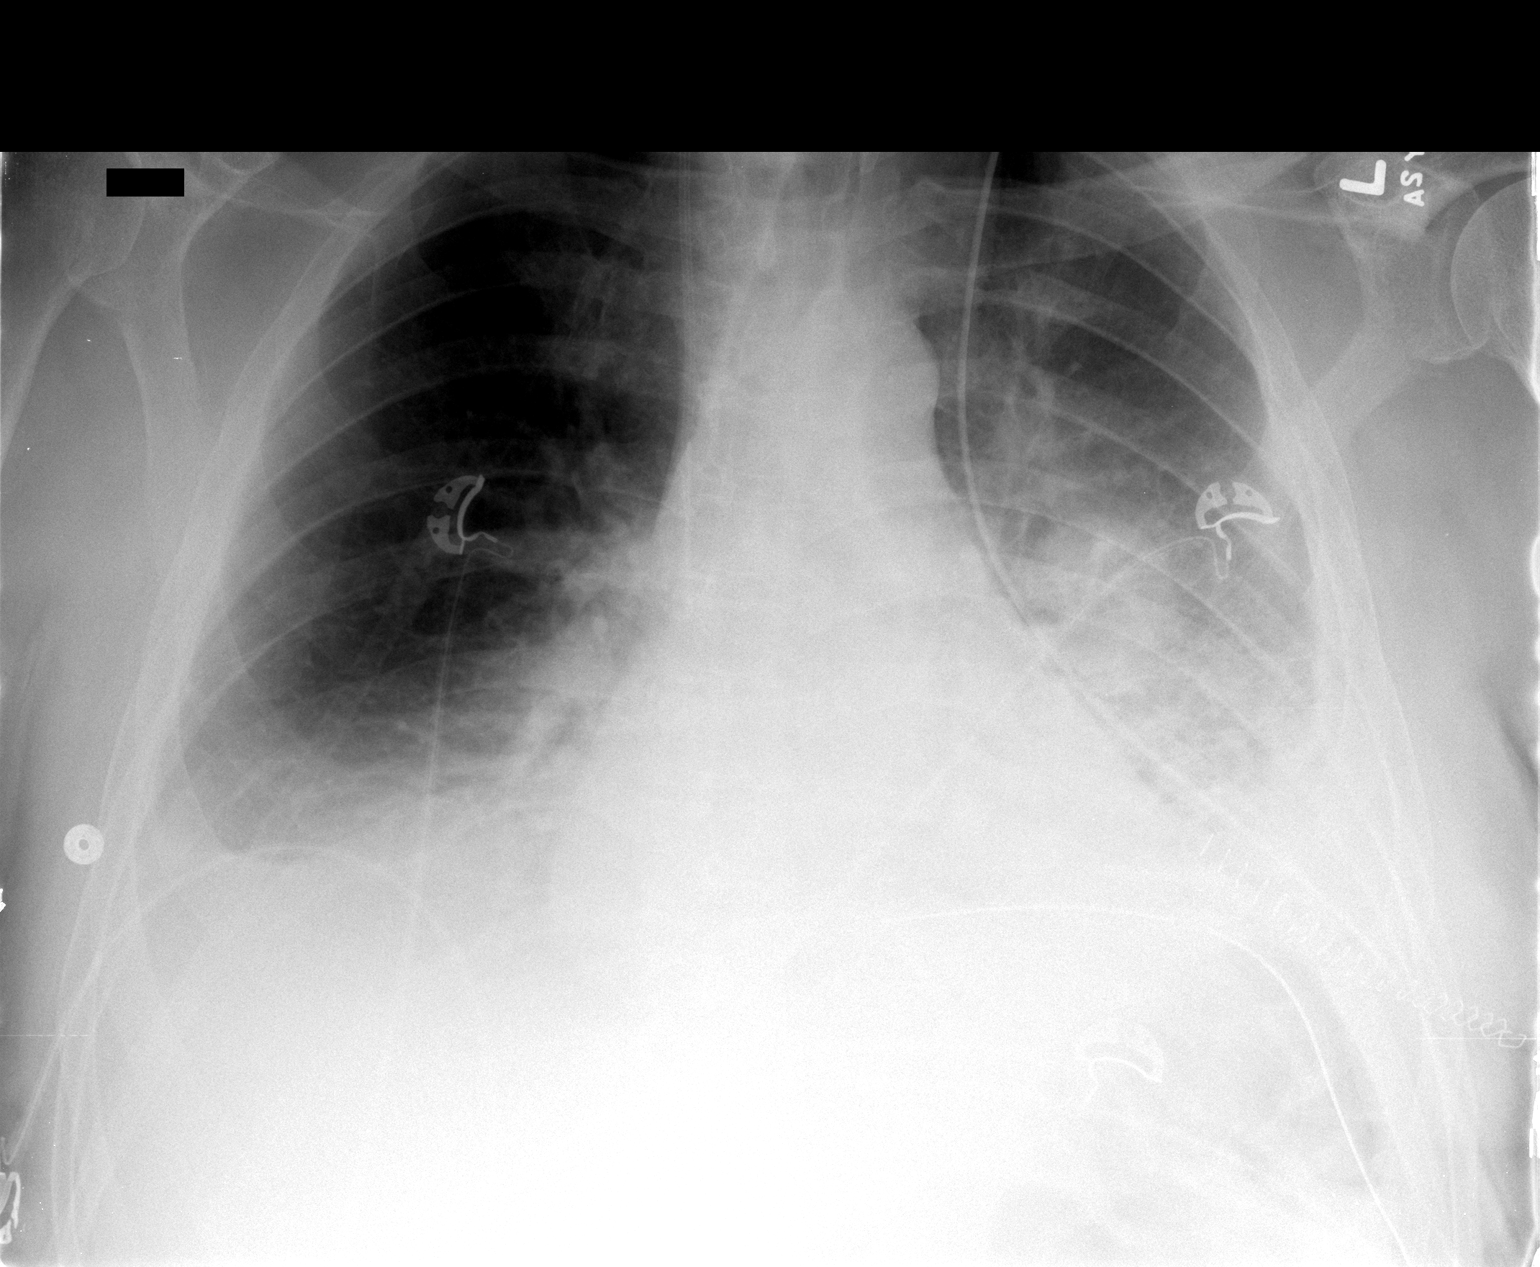

[1 of 1 positions shown; findings below may reference images not displayed]

FINDINGS: Stable position of the left chest tube.  Persistent
pleural and parenchymal densities in the left lung.  There is
improved aeration at the right lung base.  Heart size is grossly
stable.  There is no evidence for a large pneumothorax.  Round
density in the left hilum may be related to focal pleural fluid or
airspace disease. Stable position of the right jugular central
venous catheter.
IMPRESSION: Stable position of the left chest tubes without a large
pneumothorax.  Persistent pleural and parenchymal disease in the
left lung.

Improving aeration in the right lung.

## 2013-10-19 IMAGING — CR DG CHEST 2V
2 series · 2 of 2 positions shown · non-contrast
Comparison: Chest x-ray 01/23/2011.

CLINICAL DATA: Status post VATS

CHEST - 2 VIEW

[w chest pa]
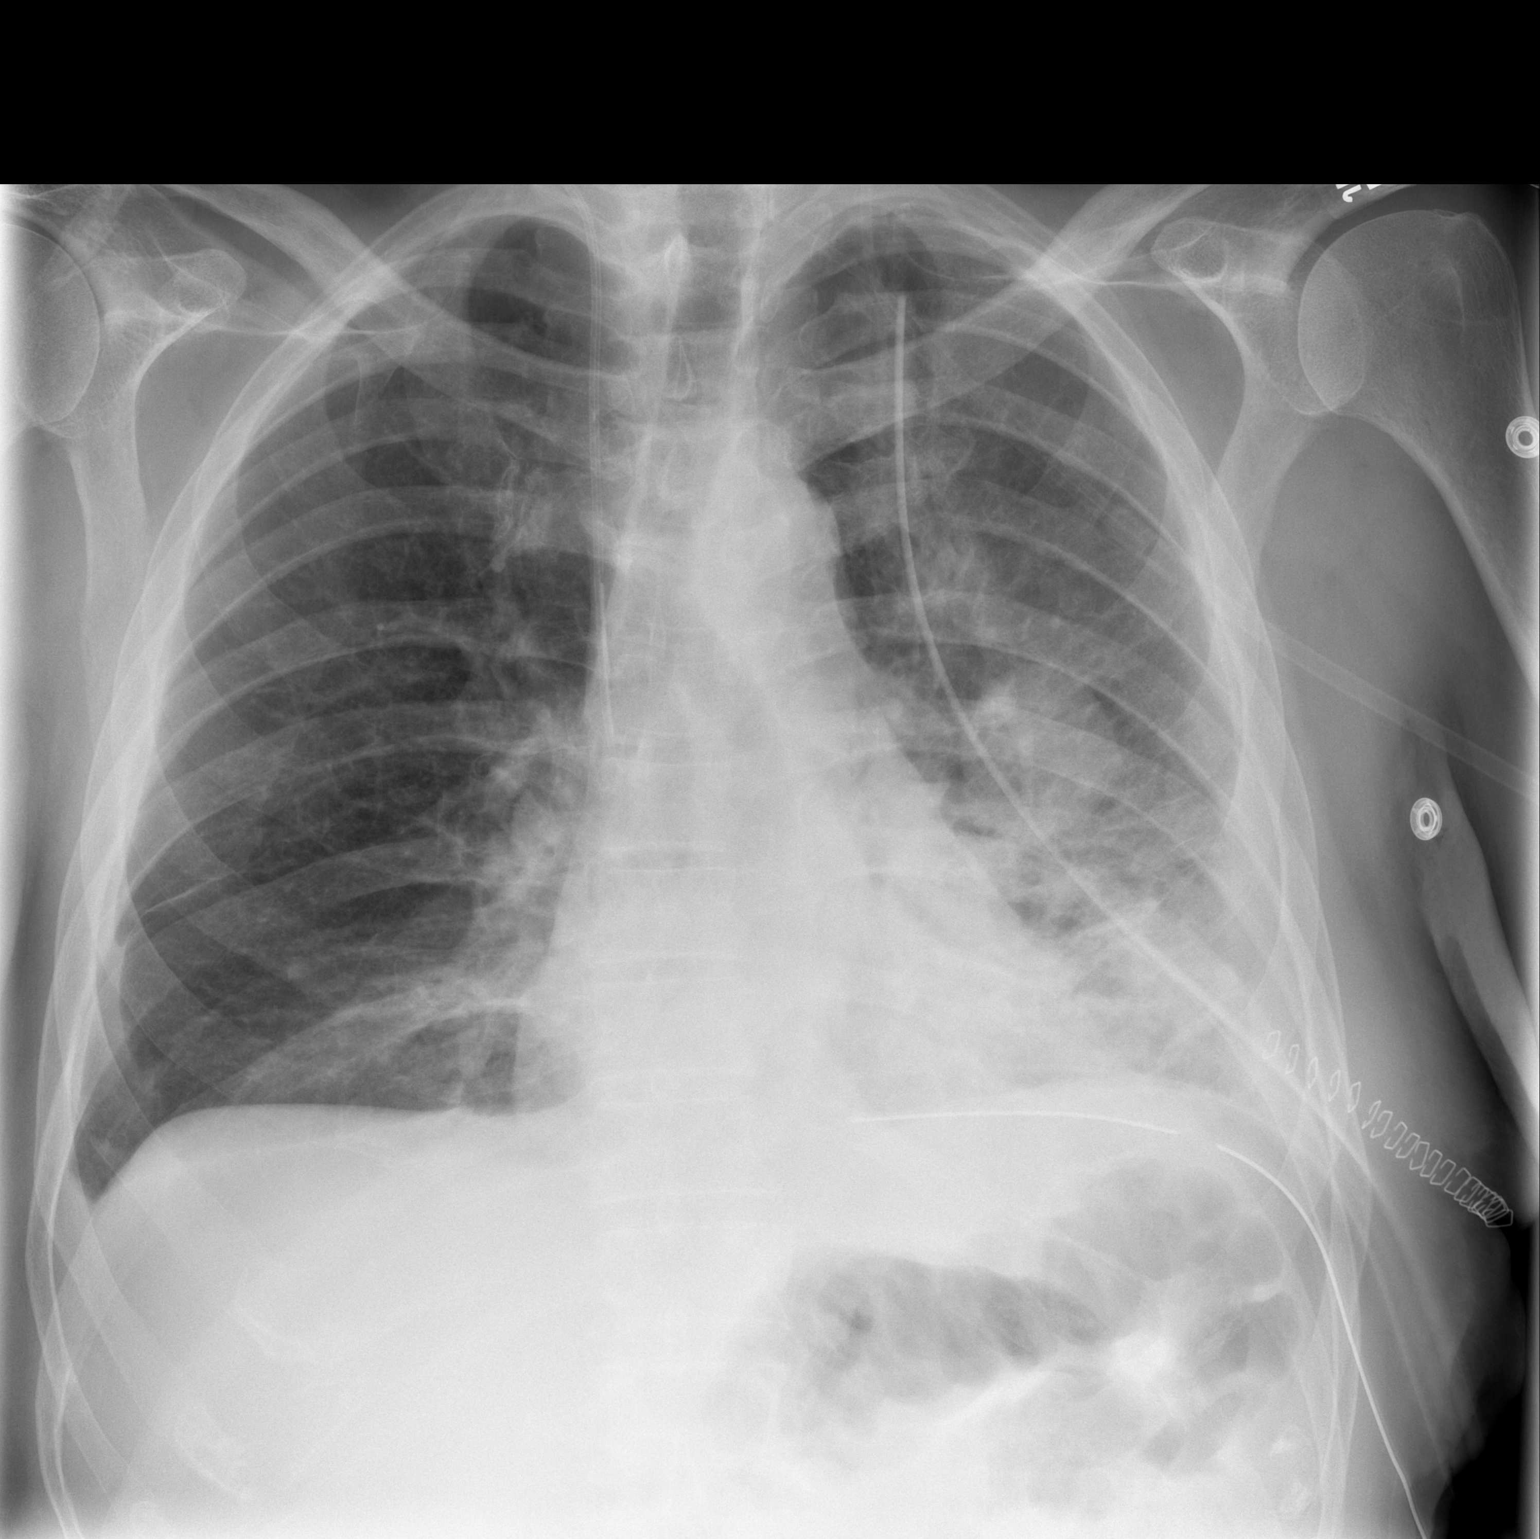

[w chest lat]
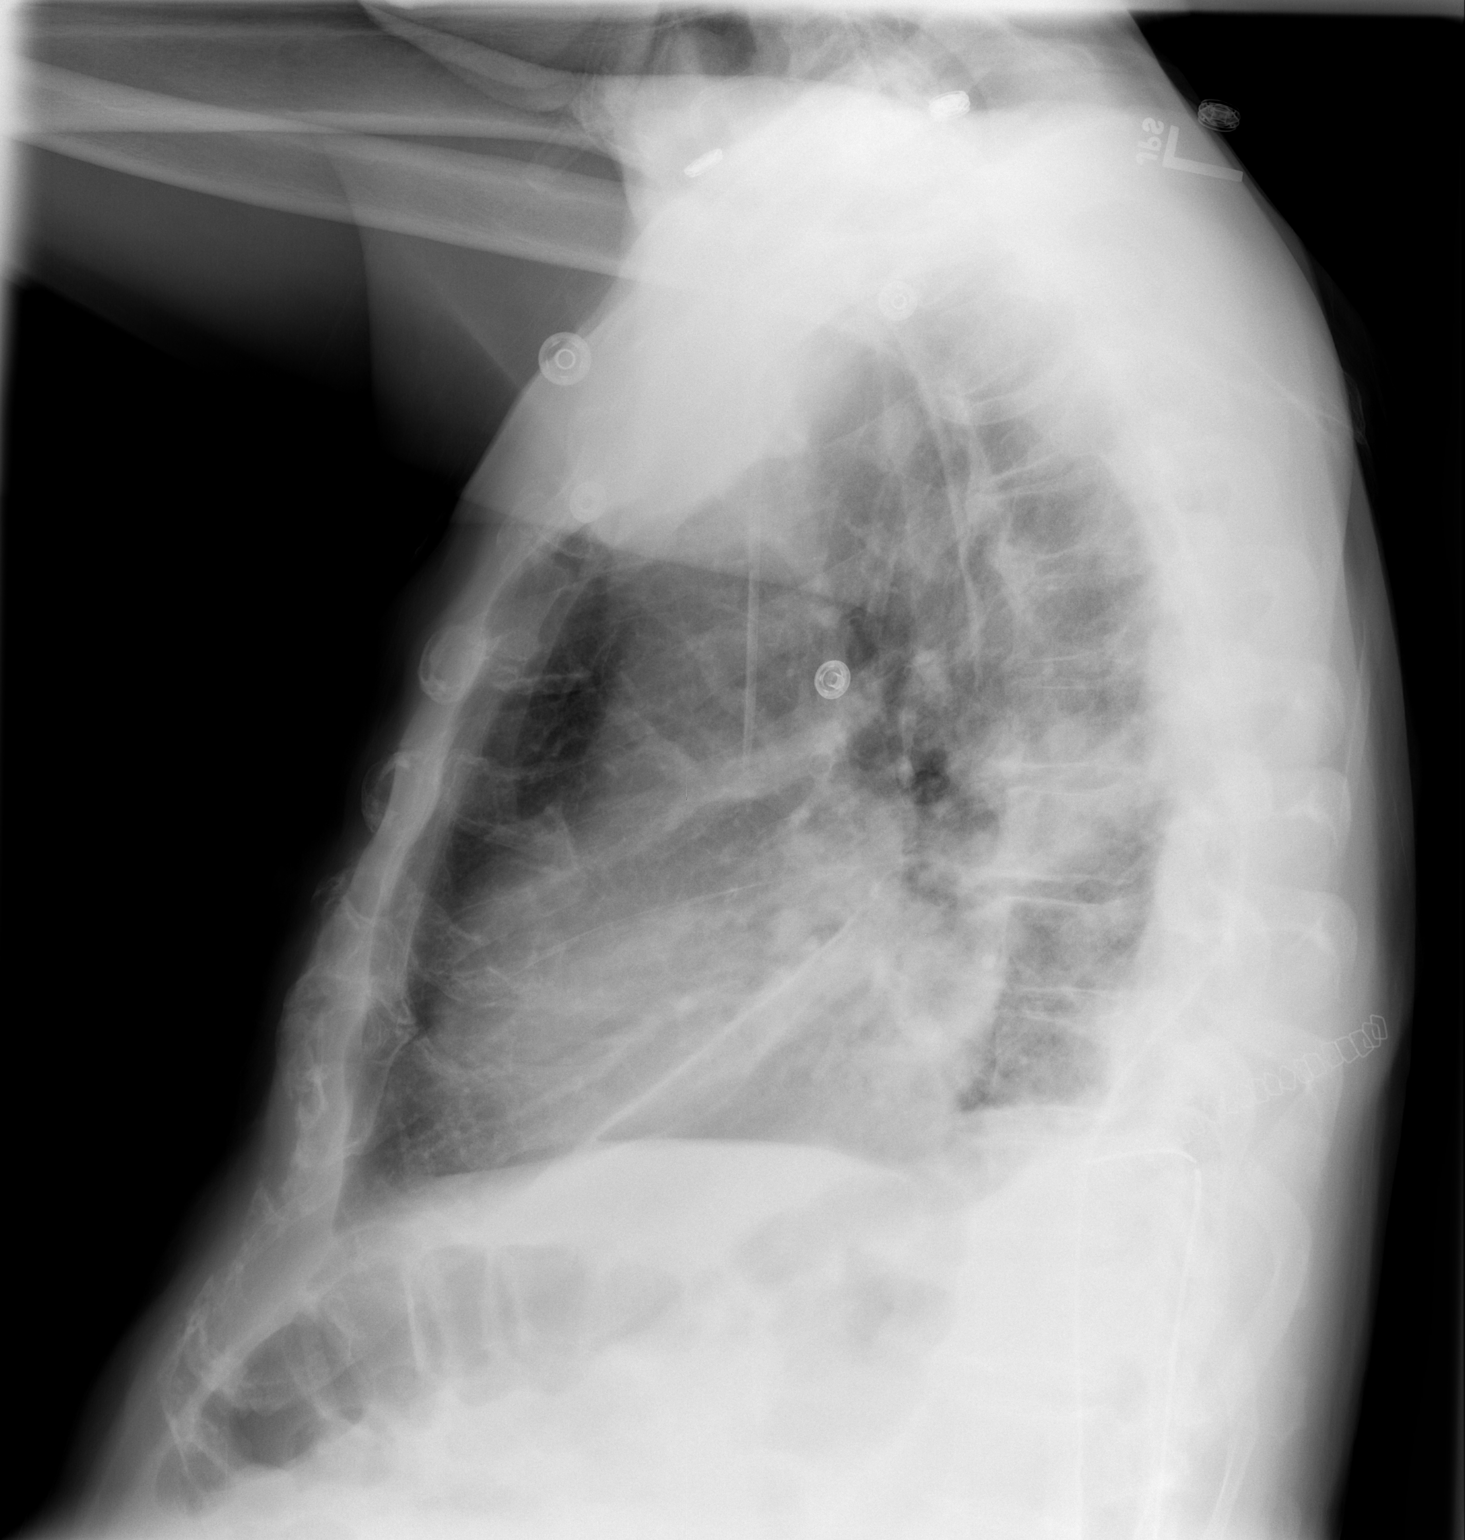

[2 of 2 positions shown; findings below may reference images not displayed]

FINDINGS: The right IJ catheter and left chest tubes are stable.
No pneumothorax.  Improved right basilar aeration.  Persistent left
lower lobe atelectasis and a pseudotumor of fluid in the major
fissure.
IMPRESSION: 1.  Stable support apparatus.
2.  Improving lung aeration.
